# Patient Record
Sex: Female | Born: 1980 | Race: White | Hispanic: No | Marital: Married | Smoking: Never smoker
Health system: Southern US, Community
[De-identification: ages and names within clinical notes are randomized; demographics above are authoritative.]

## PROBLEM LIST (undated history)

## (undated) DIAGNOSIS — R011 Cardiac murmur, unspecified: Secondary | ICD-10-CM

## (undated) DIAGNOSIS — Z8619 Personal history of other infectious and parasitic diseases: Secondary | ICD-10-CM

## (undated) DIAGNOSIS — Z8489 Family history of other specified conditions: Secondary | ICD-10-CM

## (undated) HISTORY — DX: Personal history of other infectious and parasitic diseases: Z86.19

---

## 2004-06-14 ENCOUNTER — Emergency Department: Payer: Self-pay | Admitting: Internal Medicine

## 2007-02-14 ENCOUNTER — Emergency Department (HOSPITAL_COMMUNITY): Admission: EM | Admit: 2007-02-14 | Discharge: 2007-02-14 | Payer: Self-pay | Admitting: Emergency Medicine

## 2008-01-19 ENCOUNTER — Emergency Department (HOSPITAL_COMMUNITY): Admission: EM | Admit: 2008-01-19 | Discharge: 2008-01-20 | Payer: Self-pay | Admitting: Emergency Medicine

## 2008-01-19 LAB — TSH: TSH: 2.4 u[IU]/mL (ref <=5.90)

## 2009-06-03 ENCOUNTER — Ambulatory Visit (HOSPITAL_COMMUNITY): Admission: RE | Admit: 2009-06-03 | Discharge: 2009-06-03 | Payer: Self-pay | Admitting: Obstetrics and Gynecology

## 2009-11-01 ENCOUNTER — Inpatient Hospital Stay (HOSPITAL_COMMUNITY): Admission: AD | Admit: 2009-11-01 | Discharge: 2009-11-01 | Payer: Self-pay | Admitting: Obstetrics and Gynecology

## 2009-11-02 ENCOUNTER — Inpatient Hospital Stay (HOSPITAL_COMMUNITY): Admission: AD | Admit: 2009-11-02 | Discharge: 2009-11-05 | Payer: Self-pay | Admitting: Obstetrics and Gynecology

## 2009-11-05 ENCOUNTER — Encounter: Admission: RE | Admit: 2009-11-05 | Discharge: 2009-11-06 | Payer: Self-pay | Admitting: Obstetrics and Gynecology

## 2009-12-06 ENCOUNTER — Encounter
Admission: RE | Admit: 2009-12-06 | Discharge: 2010-01-05 | Payer: Self-pay | Source: Home / Self Care | Admitting: Obstetrics and Gynecology

## 2010-01-06 ENCOUNTER — Encounter
Admission: RE | Admit: 2010-01-06 | Discharge: 2010-01-13 | Payer: Self-pay | Source: Home / Self Care | Attending: Obstetrics and Gynecology | Admitting: Obstetrics and Gynecology

## 2010-04-24 LAB — CBC
HCT: 22.9 % — ABNORMAL LOW (ref 36.0–46.0)
Hemoglobin: 7.7 g/dL — ABNORMAL LOW (ref 12.0–15.0)
MCH: 28.5 pg (ref 26.0–34.0)
MCHC: 33.5 g/dL (ref 30.0–36.0)
MCHC: 33.6 g/dL (ref 30.0–36.0)
Platelets: 242 10*3/uL (ref 150–400)
RBC: 2.7 MIL/uL — ABNORMAL LOW (ref 3.87–5.11)
RBC: 3.34 MIL/uL — ABNORMAL LOW (ref 3.87–5.11)
RDW: 14.5 % (ref 11.5–15.5)
WBC: 17.3 10*3/uL — ABNORMAL HIGH (ref 4.0–10.5)

## 2010-04-24 LAB — BASIC METABOLIC PANEL
Creatinine, Ser: 0.86 mg/dL (ref 0.4–1.2)
GFR calc Af Amer: >60 mL/min (ref >60)
GFR calc non Af Amer: >60 mL/min (ref >60)
Glucose, Bld: 80 mg/dL (ref 70–99)
Potassium: 3.5 mEq/L (ref 3.5–5.1)

## 2010-11-14 LAB — POCT I-STAT, CHEM 8
BUN: 5 mg/dL — ABNORMAL LOW (ref 6–23)
Calcium, Ion: 1.19 mmol/L (ref 1.12–1.32)
Creatinine, Ser: 1.1 mg/dL (ref 0.4–1.2)
Glucose, Bld: 97 mg/dL (ref 70–99)
HCT: 41 % (ref 36.0–46.0)
Potassium: 3.4 mEq/L — ABNORMAL LOW (ref 3.5–5.1)
TCO2: 26 mmol/L (ref 0–100)

## 2010-11-14 LAB — WET PREP, GENITAL
Trich, Wet Prep: NONE SEEN
Yeast Wet Prep HPF POC: NONE SEEN

## 2010-11-14 LAB — URINE MICROSCOPIC-ADD ON

## 2010-11-14 LAB — URINALYSIS, ROUTINE W REFLEX MICROSCOPIC
Leukocytes, UA: NEGATIVE
Nitrite: NEGATIVE
Specific Gravity, Urine: 1 — ABNORMAL LOW (ref 1.005–1.030)
Urobilinogen, UA: 0.2 mg/dL (ref 0.0–1.0)

## 2010-11-14 LAB — DIFFERENTIAL
Eosinophils Relative: 3 % (ref 0–5)
Monocytes Relative: 7 % (ref 3–12)
Neutro Abs: 3.5 10*3/uL (ref 1.7–7.7)
Neutrophils Relative %: 48 % (ref 43–77)

## 2010-11-14 LAB — CBC
HCT: 40.2 % (ref 36.0–46.0)
Platelets: 185 10*3/uL (ref 150–400)
RDW: 11.9 % (ref 11.5–15.5)
WBC: 7.4 10*3/uL (ref 4.0–10.5)

## 2010-11-14 LAB — GC/CHLAMYDIA PROBE AMP, GENITAL: GC Probe Amp, Genital: NEGATIVE

## 2011-09-19 IMAGING — US US OB DETAIL+14 WK
1 series · 14 of 28 positions shown · non-contrast
Comparison: none

OBSTETRICAL ULTRASOUND:
 This ultrasound exam was performed in the [HOSPITAL] Ultrasound Department.  The OB US report was generated in the AS system, and faxed to the ordering physician.  This report is also available in [HOSPITAL]?s AccessANYware and in [REDACTED] PACS.

[Series 1: us ob detail +14 wk · 0.22mm/px · 14 of 102 slices shown]
[im 4/102]
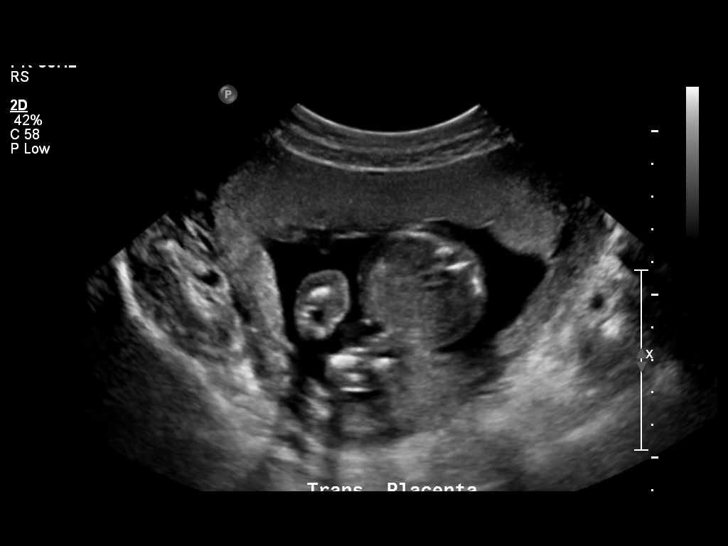
[im 12/102]
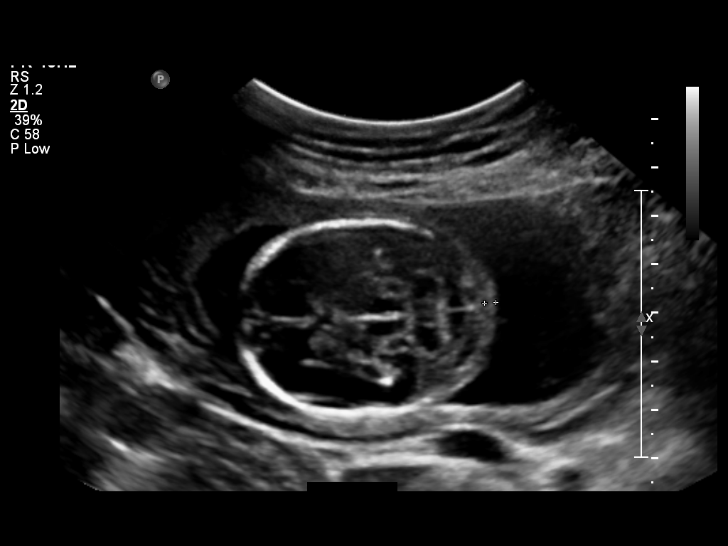
[im 19/102]
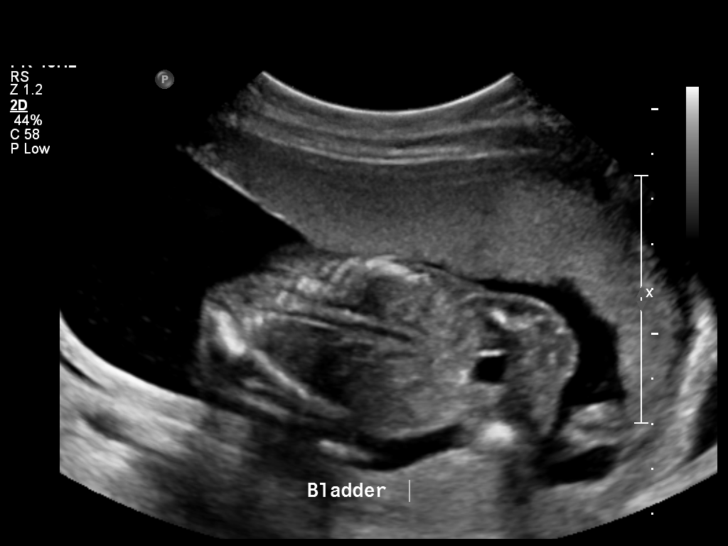
[im 27/102]
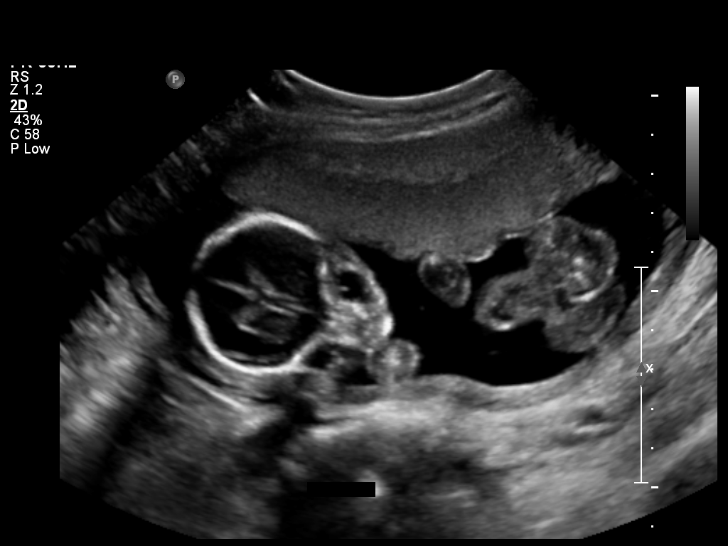
[im 34/102]
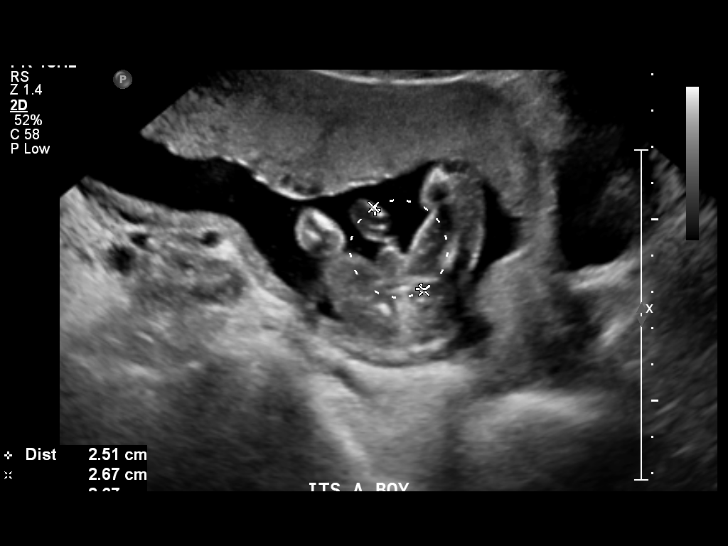
[im 42/102]
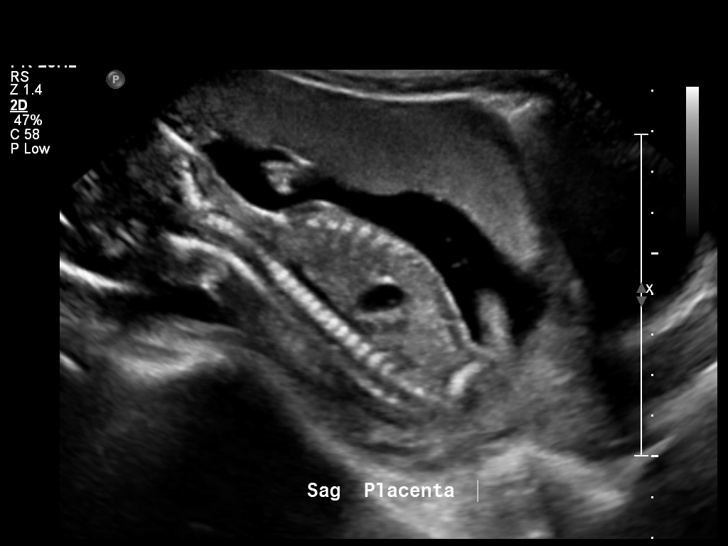
[im 49/102]
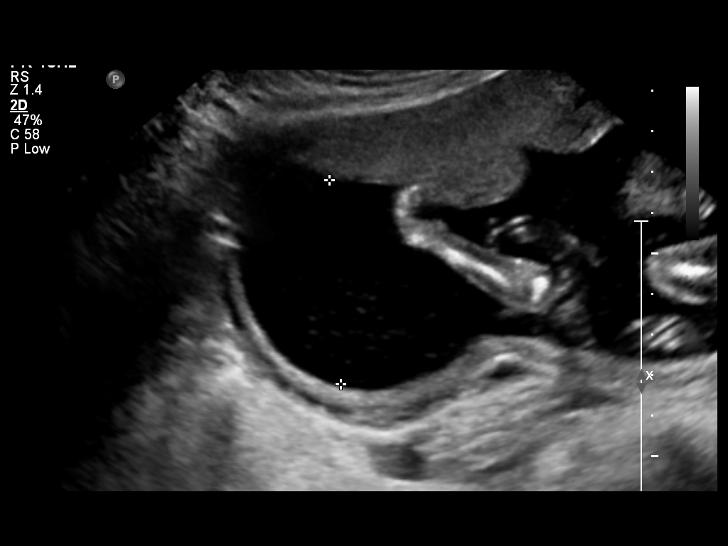
[im 57/102]
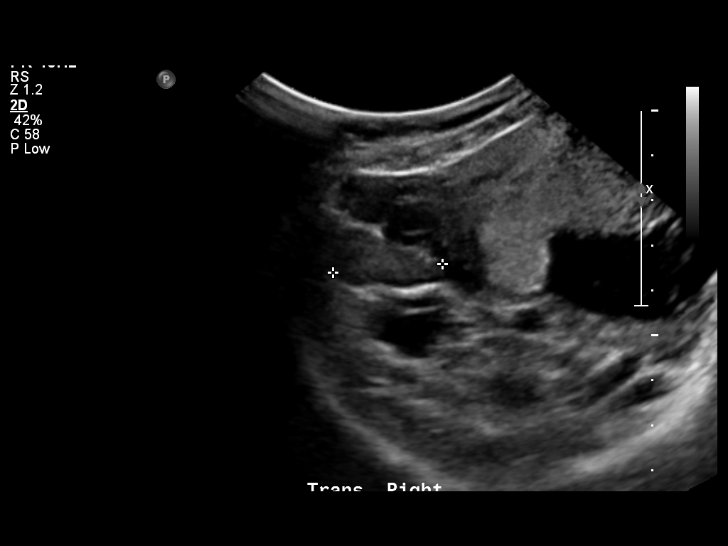
[im 64/102]
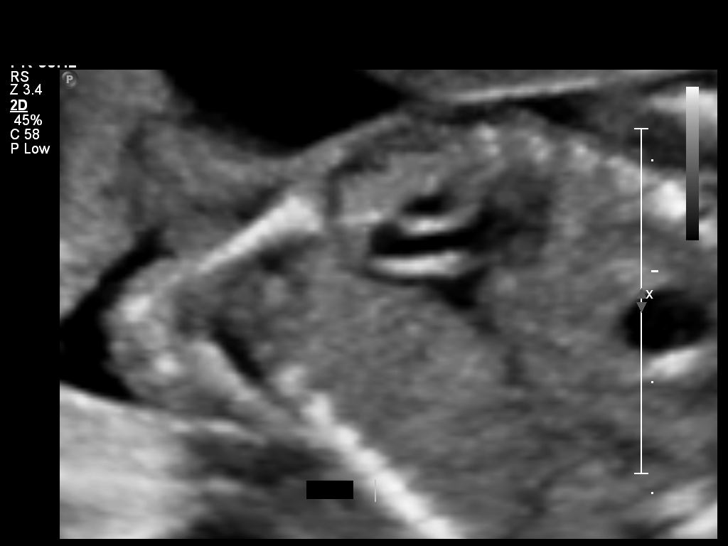
[im 72/102]
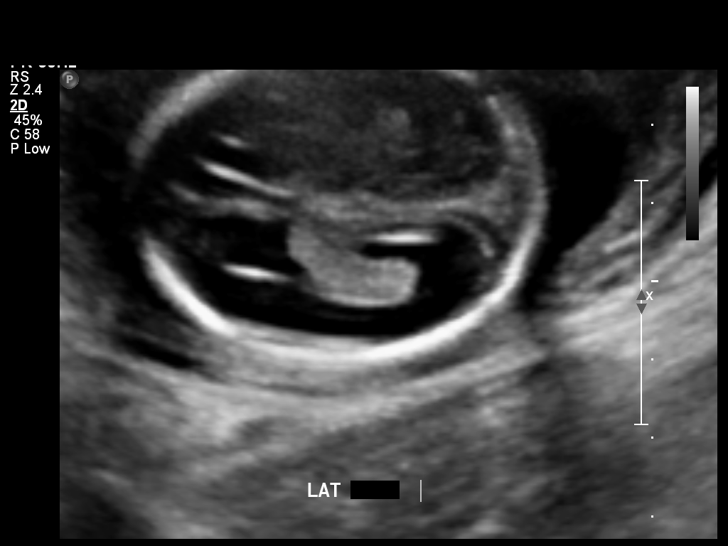
[im 79/102]
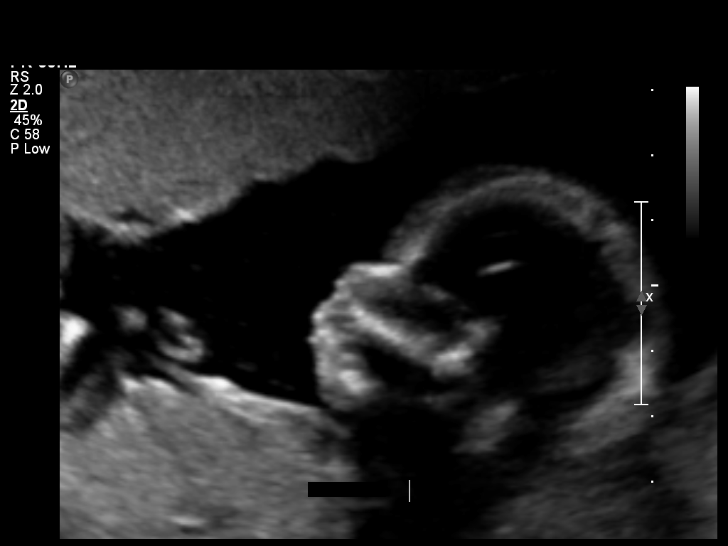
[im 87/102]
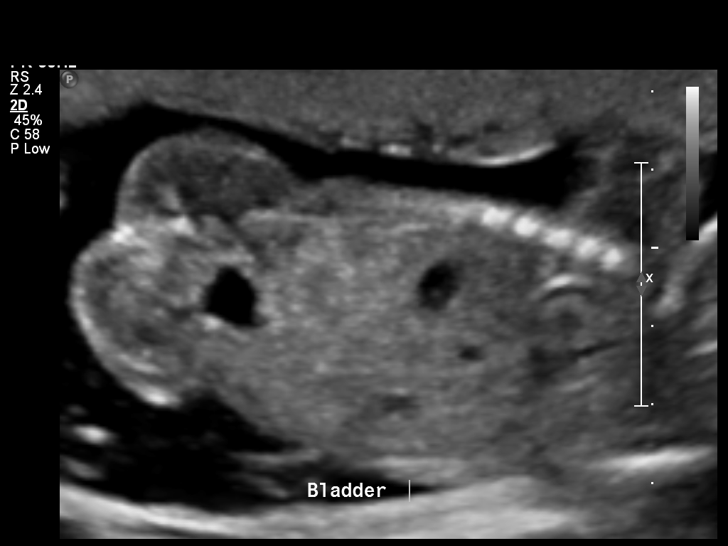
[im 94/102]
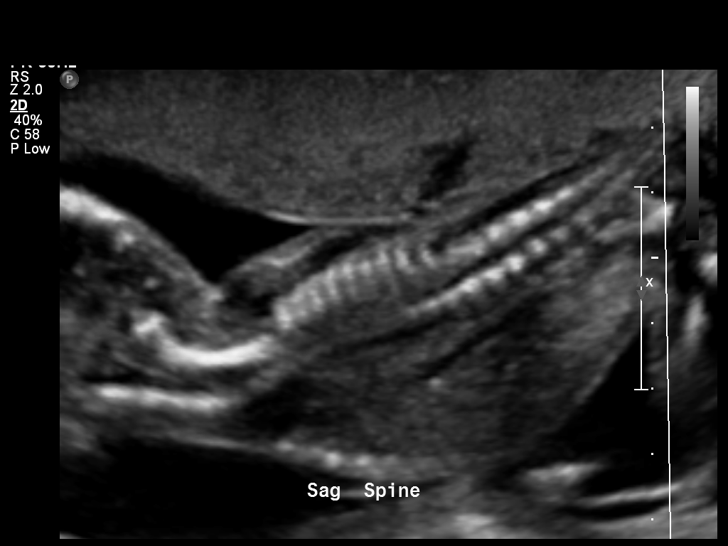
[im 102/102]
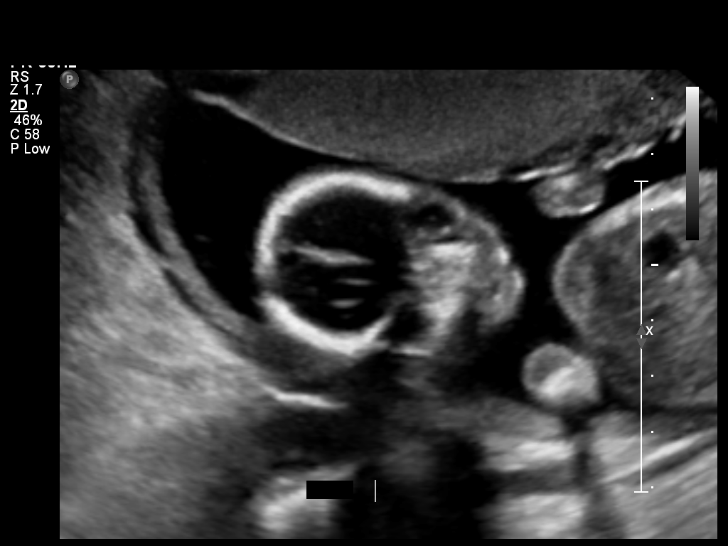

[14 of 28 positions shown; findings below may reference images not displayed]

IMPRESSION: See AS Obstetric US report.

## 2011-11-20 ENCOUNTER — Other Ambulatory Visit (HOSPITAL_COMMUNITY)
Admission: RE | Admit: 2011-11-20 | Discharge: 2011-11-20 | Disposition: A | Discharge status: Home / Self Care | Payer: BC Managed Care – PPO | Source: Ambulatory Visit | Attending: Internal Medicine | Admitting: Internal Medicine

## 2011-11-20 ENCOUNTER — Encounter: Payer: Self-pay | Admitting: Internal Medicine

## 2011-11-20 ENCOUNTER — Ambulatory Visit (INDEPENDENT_AMBULATORY_CARE_PROVIDER_SITE_OTHER): Payer: BC Managed Care – PPO | Admitting: Internal Medicine

## 2011-11-20 VITALS — BP 100/82 | HR 82 | Temp 98.3°F | Ht 60.0 in | Wt 122.2 lb

## 2011-11-20 DIAGNOSIS — R5383 Other fatigue: Secondary | ICD-10-CM

## 2011-11-20 DIAGNOSIS — Z139 Encounter for screening, unspecified: Secondary | ICD-10-CM

## 2011-11-20 DIAGNOSIS — Z01419 Encounter for gynecological examination (general) (routine) without abnormal findings: Secondary | ICD-10-CM | POA: Insufficient documentation

## 2011-11-20 DIAGNOSIS — Z1151 Encounter for screening for human papillomavirus (HPV): Secondary | ICD-10-CM | POA: Insufficient documentation

## 2011-11-20 DIAGNOSIS — R5381 Other malaise: Secondary | ICD-10-CM

## 2011-11-20 LAB — COMPREHENSIVE METABOLIC PANEL
Albumin: 3.6 g/dL (ref 3.5–5.2)
BUN: 13 mg/dL (ref 6–23)
CO2: 24 mEq/L (ref 19–32)
Calcium: 8.4 mg/dL (ref 8.4–10.5)
Chloride: 108 mEq/L (ref 96–112)
Glucose, Bld: 76 mg/dL (ref 70–99)
Potassium: 3.8 mEq/L (ref 3.5–5.1)

## 2011-11-20 LAB — CBC WITH DIFFERENTIAL/PLATELET
Basophils Relative: 0.3 % (ref 0.0–3.0)
Eosinophils Relative: 2.4 % (ref 0.0–5.0)
Lymphocytes Relative: 31.8 % (ref 12.0–46.0)
MCV: 89.3 fl (ref 78.0–100.0)
Neutrophils Relative %: 59.4 % (ref 43.0–77.0)
RBC: 4.62 Mil/uL (ref 3.87–5.11)
WBC: 5.8 10*3/uL (ref 4.5–10.5)

## 2011-11-20 LAB — LIPID PANEL
Cholesterol: 181 mg/dL (ref 0–200)
LDL Cholesterol: 126 mg/dL — ABNORMAL HIGH (ref 0–99)

## 2011-11-20 LAB — TSH: TSH: 3.32 u[IU]/mL (ref 0.35–5.50)

## 2011-11-20 MED ORDER — NORETHIN ACE-ETH ESTRAD-FE 1-20 MG-MCG PO TABS: 1.0000 | ORAL_TABLET | Freq: Every day | ORAL | Status: DC

## 2011-11-20 NOTE — Patient Instructions (Signed)
It was good seeing you today.  We will notify you of your lab results once they become available.  Let me know if any problems.

## 2011-11-20 NOTE — Progress Notes (Signed)
  Subjective:    Patient ID: Holly Oliver, female    DOB: [birth year], 31 y.o.   MRN: [id number]  HPI 31 year old female who comes in to establish care here at [facility].  I previously saw her at [facility].  She has been doing well.  No significant allergy issues.  Has a son two years old.  Doing well.  Has been under some stress recently.  Husband has some health issues and was out of a job.  He now has a job and is planning to start next week.  Doing better.  Periods are regular.  Taking Loestrin regularly.  Desires a refill.    Past Medical History  Diagnosis Date  . Chicken pox     Review of Systems Patient denies any headache, lightheadedness or dizziness.  No sinus congestion or allergy issues. No chest pain, tightness or palpatations.  No increased shortness of breath, cough or congestion.  No acid reflux, dysphagia or odynophagia.  No nausea or vomiting.  No abdominal pain or cramping.  No bowel change, such as diarrhea, constipation, BRBPR or melana.  No urine change.  No vaginal problems.  Has a two year old.  Had a C-section.  Was being followed at [facility].  Wants to get her physicals here now.  Last - 2012.   She does report noticing some fatigue.      Objective:   Physical Exam Filed Vitals:   11/20/11 0835  BP: 100/82  Pulse: 82  Temp: 98.3 F (36.8 C)   31year old female in no acute distress.   HEENT:  Nares- clear.  Oropharynx - without lesions. NECK:  Supple.  Nontender.  No audible bruit.  HEART:  Appears to be regular. LUNGS:  No crackles or wheezing audible.  Respirations even and unlabored.  RADIAL PULSE:  Equal bilaterally.    BREASTS:  No nipple discharge or nipple retraction present.  Could not appreciate any distinct nodules or axillary adenopathy.  ABDOMEN:  Soft, nontender.  Bowel sounds present and normal.  No audible abdominal bruit.  GU:  Normal external genitalia.  Vaginal vault without lesions.  Cervix identified.  Pap performed. Could not  appreciate any adnexal masses or tenderness.   RECTAL:  Not performed.    EXTREMITIES:  No increased edema present.  DP pulses palpable and equal bilaterally.        Assessment & Plan:  FATIGUE.  Will check cbc, met c and tsh to confirm no metabolic etiology.  Stress is better now.  Follow.   INCREASED PSYCHOSOCIAL STRESSORS.  Doing well.  Feels she is coping well.  Follow.  I spent over 25 minutes with the patient and more than 50% of the time spent discussing above.    GYN.  On Loestrin FE 1/20.  Doing well.  Periods regular.  Pelvic/pap today.   HEALTH MAINTENANCE.  Physical today.  Check cholesterol at patients request.  Labs to be checked as outlined.

## 2011-11-27 ENCOUNTER — Encounter: Payer: Self-pay | Admitting: *Deleted

## 2011-12-11 ENCOUNTER — Encounter: Payer: Self-pay | Admitting: Internal Medicine

## 2011-12-11 NOTE — Telephone Encounter (Signed)
Please call pt and notify her that I would like to see her - given these issues.  I can see her on 12/17/11 at 4:15.  Please confirm with her she is ok to wait until then for appt.  If so, then forward to Robin to put on schedule.  Thanks.

## 2012-01-01 ENCOUNTER — Encounter: Payer: BC Managed Care – PPO | Admitting: Internal Medicine

## 2012-01-01 ENCOUNTER — Encounter: Payer: Self-pay | Admitting: Internal Medicine

## 2012-01-01 ENCOUNTER — Ambulatory Visit: Payer: BC Managed Care – PPO | Admitting: Internal Medicine

## 2012-01-08 ENCOUNTER — Encounter: Payer: Self-pay | Admitting: *Deleted

## 2012-01-20 ENCOUNTER — Ambulatory Visit: Payer: BC Managed Care – PPO | Admitting: Internal Medicine

## 2012-01-20 ENCOUNTER — Encounter: Payer: Self-pay | Admitting: Internal Medicine

## 2012-01-20 ENCOUNTER — Encounter: Payer: Self-pay | Admitting: *Deleted

## 2012-01-20 NOTE — Progress Notes (Signed)
  Subjective:    Patient ID: Holly Oliver, female    DOB: [birth year], 31 y.o.   MRN: [id number]  HPI   Review of Systems     Objective:   Physical Exam        Assessment & Plan:  The patient did not show for this appt.  This was opened for abstraction only.

## 2012-03-26 ENCOUNTER — Other Ambulatory Visit: Payer: Self-pay

## 2012-04-05 ENCOUNTER — Telehealth: Payer: Self-pay | Admitting: Internal Medicine

## 2012-04-05 NOTE — Telephone Encounter (Signed)
Would like to get the plan B prescribed.

## 2012-04-05 NOTE — Telephone Encounter (Signed)
Called patient to get more information but no answer and mail box on phone is full.

## 2012-04-05 NOTE — Telephone Encounter (Signed)
Need information from pt

## 2012-04-06 NOTE — Telephone Encounter (Signed)
Discussed with pt.  I explained to her that I did not prescribe this.  She informed me she was aware it was otc.  I also informed her that she could go to the health dept.  She expressed understanding.

## 2012-04-06 NOTE — Telephone Encounter (Signed)
Messed up on birth control a day or to before intercourse. Has concern about having conceived.

## 2012-05-10 ENCOUNTER — Ambulatory Visit (INDEPENDENT_AMBULATORY_CARE_PROVIDER_SITE_OTHER): Payer: BC Managed Care – PPO | Admitting: Adult Health

## 2012-05-10 ENCOUNTER — Encounter: Payer: Self-pay | Admitting: Adult Health

## 2012-05-10 VITALS — BP 103/72 | HR 80 | Temp 98.2°F | Resp 14 | Ht 60.0 in | Wt 124.0 lb

## 2012-05-10 DIAGNOSIS — R5383 Other fatigue: Secondary | ICD-10-CM | POA: Insufficient documentation

## 2012-05-10 DIAGNOSIS — R300 Dysuria: Secondary | ICD-10-CM

## 2012-05-10 LAB — POCT URINALYSIS DIPSTICK
Bilirubin, UA: NEGATIVE
Ketones, UA: NEGATIVE
Protein, UA: 30

## 2012-05-10 MED ORDER — CIPROFLOXACIN HCL 250 MG PO TABS: 250.0000 mg | ORAL_TABLET | Freq: Two times a day (BID) | ORAL | Status: DC

## 2012-05-10 NOTE — Patient Instructions (Addendum)
  Please start your antibiotic today - Cipro 250 mg twice a day for 3 days.  You can continue the AZO for 2 more days if needed.

## 2012-05-10 NOTE — Assessment & Plan Note (Signed)
Urine dipstick shows positive nitrite, small amount of blood. Send urine for culture and microscopic eval. Start Cipro x 3 days.

## 2012-05-10 NOTE — Progress Notes (Signed)
  Subjective:    Patient ID: Holly Oliver, female    DOB: [birth year], 32 y.o.   MRN: [id number]  HPI  Patient presents to clinic with c/o dysuria which began yesterday. She is taking AZO which has relieved her symptoms but not completely.    Review of Systems  Constitutional: Negative for fever and chills.  Genitourinary: Positive for dysuria. Negative for hematuria, flank pain, vaginal discharge and difficulty urinating.   BP 103/72  Pulse 80  Temp(Src) 98.2 F (36.8 C) (Oral)  Resp 14  Ht 5' (1.524 m)  Wt 124 lb (56.246 kg)  BMI 24.22 kg/m2  SpO2 97%  LMP 05/02/2012     Objective:   Physical Exam  Constitutional: She is oriented to person, place, and time. She appears well-developed and well-nourished. No distress.  Cardiovascular: Normal rate and regular rhythm.   Pulmonary/Chest: Effort normal.  Neurological: She is alert and oriented to person, place, and time.  Skin: Skin is warm and dry.  Psychiatric: She has a normal mood and affect. Her behavior is normal.          Assessment & Plan:

## 2012-05-13 LAB — URINE CULTURE

## 2012-11-21 ENCOUNTER — Other Ambulatory Visit: Payer: Self-pay | Admitting: Internal Medicine

## 2012-12-08 ENCOUNTER — Ambulatory Visit (INDEPENDENT_AMBULATORY_CARE_PROVIDER_SITE_OTHER): Payer: BC Managed Care – PPO | Admitting: Adult Health

## 2012-12-08 ENCOUNTER — Telehealth: Payer: Self-pay | Admitting: *Deleted

## 2012-12-08 ENCOUNTER — Encounter: Payer: Self-pay | Admitting: Adult Health

## 2012-12-08 VITALS — BP 102/60 | HR 110 | Temp 98.3°F | Resp 12 | Wt 125.5 lb

## 2012-12-08 DIAGNOSIS — R635 Abnormal weight gain: Secondary | ICD-10-CM | POA: Insufficient documentation

## 2012-12-08 DIAGNOSIS — R109 Unspecified abdominal pain: Secondary | ICD-10-CM

## 2012-12-08 DIAGNOSIS — R509 Fever, unspecified: Secondary | ICD-10-CM

## 2012-12-08 LAB — POCT URINALYSIS DIPSTICK
Glucose, UA: NEGATIVE
Leukocytes, UA: NEGATIVE
Nitrite, UA: NEGATIVE
Protein, UA: 30
Urobilinogen, UA: 0.2
pH, UA: 5.5

## 2012-12-08 MED ORDER — AMOXICILLIN-POT CLAVULANATE 875-125 MG PO TABS: 1.0000 | ORAL_TABLET | Freq: Two times a day (BID) | ORAL | Status: DC

## 2012-12-08 NOTE — Patient Instructions (Signed)
Start Augmentin twice daily for 10 days. Push fluids. Continue tylenol for fever. May alternate with ibuprofen.  Call if you develop any other symptoms or if your fever does not improve.

## 2012-12-08 NOTE — Addendum Note (Signed)
Addended by: Montine Circle D on: 12/08/2012 02:04 PM   Modules accepted: Orders

## 2012-12-08 NOTE — Assessment & Plan Note (Addendum)
Augmentin bid x 10 days. Push fluids. Continue tylenol for fever. May alternate with ibuprofen. RTC or call immediately if symptoms worsen or develops other symptoms.

## 2012-12-08 NOTE — Progress Notes (Signed)
  Subjective:    Patient ID: Holly Oliver, female    DOB: Nov 08, 1980, 32 y.o.   MRN: [id number]  HPI  Pt presents with flank pain x 3 days which has improved. Pt reports generalized body aches and sore throat beginning 2 days ago. Developed fever yesterday, highest 102.  Has been taking Tylenol for fever. She works at [employer] with [employer]. She denies any sick contacts (that she has been aware of). No GI symptoms.   Review of Systems  Constitutional: Positive for fever and chills.  HENT: Positive for sore throat. Negative for trouble swallowing.   Respiratory: Negative for cough, chest tightness, shortness of breath and wheezing.   Genitourinary: Positive for flank pain. Negative for dysuria, frequency and pelvic pain.       Objective:   Physical Exam  Constitutional: She is oriented to person, place, and time. She appears well-developed and well-nourished.  HENT:  Head: Normocephalic and atraumatic.  Right Ear: External ear normal.  Left Ear: External ear normal.  Mouth/Throat: Mucous membranes are normal. Posterior oropharyngeal erythema present. No oropharyngeal exudate or posterior oropharyngeal edema.  Cardiovascular: Normal rate and regular rhythm.   Pulmonary/Chest: Effort normal and breath sounds normal. No respiratory distress. She has no wheezes.  Genitourinary:  No CVA tenderness  Lymphadenopathy:    She has cervical adenopathy.  Neurological: She is alert and oriented to person, place, and time.  Skin: Skin is warm and dry.  Psychiatric: She has a normal mood and affect. Her behavior is normal. Judgment and thought content normal.    BP 102/60  Pulse 110  Temp(Src) 98.3 F (36.8 C) (Oral)  Resp 12  Wt 125 lb 8 oz (56.926 kg)  BMI 24.51 kg/m2  SpO2 97%       Assessment & Plan:

## 2012-12-08 NOTE — Telephone Encounter (Signed)
Would you like a urine culture?  

## 2012-12-08 NOTE — Telephone Encounter (Signed)
Yes, go ahead and send it.

## 2012-12-09 ENCOUNTER — Encounter: Payer: Self-pay | Admitting: Adult Health

## 2012-12-09 LAB — URINE CULTURE: Organism ID, Bacteria: NO GROWTH

## 2012-12-15 ENCOUNTER — Other Ambulatory Visit: Payer: Self-pay

## 2013-01-02 ENCOUNTER — Encounter: Payer: BC Managed Care – PPO | Admitting: Internal Medicine

## 2013-03-20 ENCOUNTER — Telehealth: Payer: Self-pay | Admitting: Internal Medicine

## 2013-03-20 NOTE — Telephone Encounter (Signed)
[facility] [address] [place] Son diagnosed with type a flu today and wanted to get tama flu for herself

## 2013-03-20 NOTE — Telephone Encounter (Signed)
Pt currently has no symptoms

## 2013-03-21 ENCOUNTER — Other Ambulatory Visit: Payer: Self-pay | Admitting: *Deleted

## 2013-03-21 MED ORDER — OSELTAMIVIR PHOSPHATE 75 MG PO CAPS: 75.0000 mg | ORAL_CAPSULE | Freq: Every day | ORAL | Status: DC

## 2013-03-21 NOTE — Telephone Encounter (Signed)
Since household contact, can call in tamiflu 75mg  one per day for 10 days.  Let us know if any problems.

## 2013-03-21 NOTE — Telephone Encounter (Signed)
Send Rx to [facility] [place] & sent pt a mychart message

## 2013-07-14 ENCOUNTER — Ambulatory Visit: Payer: BC Managed Care – PPO | Admitting: Internal Medicine

## 2013-07-24 ENCOUNTER — Ambulatory Visit (INDEPENDENT_AMBULATORY_CARE_PROVIDER_SITE_OTHER): Payer: BC Managed Care – PPO | Admitting: Internal Medicine

## 2013-07-24 ENCOUNTER — Encounter: Payer: Self-pay | Admitting: Internal Medicine

## 2013-07-24 VITALS — BP 100/70 | HR 86 | Temp 98.3°F | Ht 60.0 in | Wt 133.5 lb

## 2013-07-24 DIAGNOSIS — R635 Abnormal weight gain: Secondary | ICD-10-CM

## 2013-07-24 DIAGNOSIS — R5383 Other fatigue: Secondary | ICD-10-CM

## 2013-07-24 DIAGNOSIS — R5382 Chronic fatigue, unspecified: Secondary | ICD-10-CM

## 2013-07-24 DIAGNOSIS — R5381 Other malaise: Secondary | ICD-10-CM

## 2013-07-24 DIAGNOSIS — Z13220 Encounter for screening for lipoid disorders: Secondary | ICD-10-CM

## 2013-07-24 LAB — LIPID PANEL
CHOL/HDL RATIO: 4
Cholesterol: 190 mg/dL (ref 0–200)
HDL: 49.4 mg/dL (ref >39.00)
LDL Cholesterol: 124 mg/dL — ABNORMAL HIGH (ref 0–99)
NonHDL: 140.6
TRIGLYCERIDES: 84 mg/dL (ref 0.0–149.0)
VLDL: 16.8 mg/dL (ref 0.0–40.0)

## 2013-07-24 LAB — COMPREHENSIVE METABOLIC PANEL
ALK PHOS: 54 U/L (ref 39–117)
ALT: 9 U/L (ref 0–35)
AST: 19 U/L (ref 0–37)
Albumin: 4 g/dL (ref 3.5–5.2)
BILIRUBIN TOTAL: 0.4 mg/dL (ref 0.2–1.2)
BUN: 12 mg/dL (ref 6–23)
CALCIUM: 8.9 mg/dL (ref 8.4–10.5)
CHLORIDE: 107 meq/L (ref 96–112)
CO2: 25 mEq/L (ref 19–32)
CREATININE: 1.2 mg/dL (ref 0.4–1.2)
GFR: 53.95 mL/min — ABNORMAL LOW (ref >60.00)
Glucose, Bld: 91 mg/dL (ref 70–99)
POTASSIUM: 4.4 meq/L (ref 3.5–5.1)
Sodium: 139 mEq/L (ref 135–145)
Total Protein: 7.2 g/dL (ref 6.0–8.3)

## 2013-07-24 LAB — CBC WITH DIFFERENTIAL/PLATELET
BASOS PCT: 0.4 % (ref 0.0–3.0)
Basophils Absolute: 0 10*3/uL (ref 0.0–0.1)
EOS PCT: 2.1 % (ref 0.0–5.0)
Eosinophils Absolute: 0.1 10*3/uL (ref 0.0–0.7)
HCT: 39 % (ref 36.0–46.0)
Hemoglobin: 12.7 g/dL (ref 12.0–15.0)
Lymphocytes Relative: 36.7 % (ref 12.0–46.0)
Lymphs Abs: 2.2 10*3/uL (ref 0.7–4.0)
MCHC: 32.7 g/dL (ref 30.0–36.0)
MCV: 87 fl (ref 78.0–100.0)
Monocytes Absolute: 0.4 10*3/uL (ref 0.1–1.0)
Monocytes Relative: 6.9 % (ref 3.0–12.0)
NEUTROS PCT: 53.9 % (ref 43.0–77.0)
Neutro Abs: 3.2 10*3/uL (ref 1.4–7.7)
Platelets: 183 10*3/uL (ref 150.0–400.0)
RBC: 4.49 Mil/uL (ref 3.87–5.11)
RDW: 13.6 % (ref 11.5–15.5)
WBC: 5.9 10*3/uL (ref 4.0–10.5)

## 2013-07-24 LAB — TSH: TSH: 1.5 u[IU]/mL (ref 0.35–4.50)

## 2013-07-24 NOTE — Progress Notes (Signed)
Pre visit review using our clinic review tool, if applicable. No additional management support is needed unless otherwise documented below in the visit note. 

## 2013-07-25 ENCOUNTER — Encounter: Payer: Self-pay | Admitting: *Deleted

## 2013-07-25 ENCOUNTER — Other Ambulatory Visit: Payer: Self-pay | Admitting: Internal Medicine

## 2013-07-25 ENCOUNTER — Encounter: Payer: Self-pay | Admitting: Internal Medicine

## 2013-07-25 DIAGNOSIS — N289 Disorder of kidney and ureter, unspecified: Secondary | ICD-10-CM

## 2013-07-25 NOTE — Progress Notes (Signed)
Order placed for f/u met b.  

## 2013-07-27 NOTE — Telephone Encounter (Signed)
Pt aware of need for lab appt, see other mychart message. Lab appt has been scheduled.

## 2013-07-30 ENCOUNTER — Encounter: Payer: Self-pay | Admitting: Internal Medicine

## 2013-07-30 NOTE — Progress Notes (Signed)
  Subjective:    Patient ID: Holly Oliver, female    DOB: 05-28-1980, 33 y.o.   MRN: [id number]  HPI 33 year old female who comes in today as a work in with concerns regarding weight gain and fatigue.  She has been doing well.  No significant allergy issues.  Breathing stable.  She does report concerns regarding weight gain.  Weighed 124 pounds 05/29/12.   In 11/28/12 - 125.8 pounds.  She has been walking.  Trying to watch what she eats.  Uses condoms for birth control.  Bowels sable.  Periods regular.      Past Medical History  Diagnosis Date  . History of chicken pox     Review of Systems Patient denies any headache, lightheadedness or dizziness.  No sinus congestion or allergy issues. No chest pain, tightness or palpitations.  No increased shortness of breath, cough or congestion.  No acid reflux, dysphagia or odynophagia.  No nausea or vomiting.  No abdominal pain or cramping.  No bowel change, such as diarrhea, constipation, BRBPR or melana.  No urine change.  No vaginal problems.  She does report noticing some fatigue.      Objective:   Physical Exam  Filed Vitals:   07/24/13 0946  BP: 100/70  Pulse: 86  Temp: 98.3 F (74.48 C)   33 year old female in no acute distress.   HEENT:  Nares- clear.  Oropharynx - without lesions. NECK:  Supple.  Nontender.  No audible bruit.  HEART:  Appears to be regular. LUNGS:  No crackles or wheezing audible.  Respirations even and unlabored.  RADIAL PULSE:  Equal bilaterally.   ABDOMEN:  Soft, nontender.  Bowel sounds present and normal.  No audible abdominal bruit.    EXTREMITIES:  No increased edema present.  DP pulses palpable and equal bilaterally.        Assessment & Plan:  FATIGUE.  Will check cbc, met c and tsh to confirm no metabolic etiology.  Stress is better now.  Follow.   INCREASED PSYCHOSOCIAL STRESSORS.  Doing well.  Feels she is coping well.  Follow.     HEALTH MAINTENANCE.  Physical is scheduled for 8/15.  Check  cholesterol.  Labs to be checked as outlined.

## 2013-07-30 NOTE — Assessment & Plan Note (Signed)
She is concerned regarding weight gain.  Check tsh.  Discussed diet and exercise.  Follow.

## 2013-07-30 NOTE — Assessment & Plan Note (Signed)
Increased fatigue.  Check cbc, met c and tsh.   Follow.    

## 2013-08-02 ENCOUNTER — Other Ambulatory Visit: Payer: BC Managed Care – PPO

## 2013-08-07 ENCOUNTER — Other Ambulatory Visit: Payer: Self-pay | Admitting: Internal Medicine

## 2013-08-07 ENCOUNTER — Encounter: Payer: Self-pay | Admitting: Internal Medicine

## 2013-08-07 ENCOUNTER — Other Ambulatory Visit (INDEPENDENT_AMBULATORY_CARE_PROVIDER_SITE_OTHER): Payer: BC Managed Care – PPO

## 2013-08-07 DIAGNOSIS — N289 Disorder of kidney and ureter, unspecified: Secondary | ICD-10-CM

## 2013-08-07 LAB — BASIC METABOLIC PANEL
BUN: 10 mg/dL (ref 6–23)
CHLORIDE: 107 meq/L (ref 96–112)
CO2: 30 mEq/L (ref 19–32)
Calcium: 8.9 mg/dL (ref 8.4–10.5)
Creatinine, Ser: 1.2 mg/dL (ref 0.4–1.2)
GFR: 56.04 mL/min — AB (ref >60.00)
GLUCOSE: 124 mg/dL — AB (ref 70–99)
Potassium: 4.2 mEq/L (ref 3.5–5.1)
Sodium: 140 mEq/L (ref 135–145)

## 2013-08-07 NOTE — Progress Notes (Signed)
Order placed for f/u met b 

## 2013-08-08 ENCOUNTER — Encounter: Payer: Self-pay | Admitting: *Deleted

## 2013-08-09 NOTE — Telephone Encounter (Signed)
Unread mychart message mailed to patient 

## 2013-09-07 ENCOUNTER — Other Ambulatory Visit: Payer: BC Managed Care – PPO

## 2013-09-11 ENCOUNTER — Encounter: Payer: BC Managed Care – PPO | Admitting: Internal Medicine

## 2013-09-11 DIAGNOSIS — Z0289 Encounter for other administrative examinations: Secondary | ICD-10-CM

## 2014-12-05 ENCOUNTER — Encounter: Payer: Self-pay | Admitting: Internal Medicine

## 2014-12-05 NOTE — Telephone Encounter (Signed)
Please notify pt that I have not seen her in over one year (1 1/2 years).  She needs an appt with me.  One of Dr Amalia Hailey partners may be able to prescribe in his absence.

## 2014-12-21 ENCOUNTER — Ambulatory Visit: Payer: BC Managed Care – PPO | Admitting: Internal Medicine

## 2015-03-29 ENCOUNTER — Ambulatory Visit (INDEPENDENT_AMBULATORY_CARE_PROVIDER_SITE_OTHER): Payer: BC Managed Care – PPO | Admitting: Internal Medicine

## 2015-03-29 ENCOUNTER — Other Ambulatory Visit (HOSPITAL_COMMUNITY)
Admission: RE | Admit: 2015-03-29 | Discharge: 2015-03-29 | Disposition: A | Discharge status: Home / Self Care | Payer: BC Managed Care – PPO | Source: Ambulatory Visit | Attending: Internal Medicine | Admitting: Internal Medicine

## 2015-03-29 ENCOUNTER — Encounter: Payer: Self-pay | Admitting: Internal Medicine

## 2015-03-29 VITALS — BP 120/78 | HR 87 | Temp 98.3°F | Resp 18 | Ht 60.0 in | Wt 140.4 lb

## 2015-03-29 DIAGNOSIS — Z1151 Encounter for screening for human papillomavirus (HPV): Secondary | ICD-10-CM | POA: Insufficient documentation

## 2015-03-29 DIAGNOSIS — H938X1 Other specified disorders of right ear: Secondary | ICD-10-CM | POA: Diagnosis not present

## 2015-03-29 DIAGNOSIS — Z124 Encounter for screening for malignant neoplasm of cervix: Secondary | ICD-10-CM | POA: Diagnosis not present

## 2015-03-29 DIAGNOSIS — Z01419 Encounter for gynecological examination (general) (routine) without abnormal findings: Secondary | ICD-10-CM | POA: Insufficient documentation

## 2015-03-29 DIAGNOSIS — Z0000 Encounter for general adult medical examination without abnormal findings: Secondary | ICD-10-CM | POA: Diagnosis not present

## 2015-03-29 DIAGNOSIS — R635 Abnormal weight gain: Secondary | ICD-10-CM

## 2015-03-29 NOTE — Progress Notes (Signed)
Pre-visit discussion using our clinic review tool. No additional management support is needed unless otherwise documented below in the visit note.  

## 2015-03-29 NOTE — Progress Notes (Signed)
Patient ID: Holly Oliver, female   DOB: 01-26-1981, 35 y.o.   MRN: [id number]

## 2015-03-29 NOTE — Patient Instructions (Signed)
nasacort nasal spray - 2 sprays each nostril one time per day.  Do this in the evening.   Afrin nasal spray - 2 sprays each nostril 2x/day for three days and then stop.

## 2015-03-29 NOTE — Progress Notes (Signed)
Patient ID: Holly Oliver, female   DOB: September 25, 1980, 35 y.o.   MRN: [id number]   Subjective:    Patient ID: Holly Oliver, female    DOB: 1980/03/14, 35 y.o.   MRN: [id number]  HPI  Patient presents for her physical exam.  Increased stress with her job.  She is starting a new job next week.  Feels this will improve her stress level.  Does not feel she needs any further intervention at this time.  Was having some congestion.  Was taking sudafed.  Is better.  Still feels right ear is muffled at times.  No pain.  No significant hearing change.  Breathing stable.  No abdominal pain or cramping. Bowels stable.  LMP started Saturday.  Regular periods.  Husband has had a vasectomy.     Past Medical History  Diagnosis Date  . History of chicken pox    Past Surgical History  Procedure Laterality Date  . Cesarean section  11/02/09   Family History  Problem Relation Age of Onset  . Hypertension Mother   . Hyperlipidemia Father   . Hyperlipidemia Mother   . Lung cancer Maternal Grandfather   . Breast cancer Maternal Aunt     great aunt  . Prostate cancer Maternal Grandfather   . Diabetes Maternal Aunt    Social History   Social History  . Marital Status: Married    Spouse Name: N/A  . Number of Children: 1  . Years of Education: N/A   Occupational History  . [employer]   Social History Main Topics  . Smoking status: Never Smoker   . Smokeless tobacco: Never Used  . Alcohol Use: No  . Drug Use: No  . Sexual Activity: Not Asked   Other Topics Concern  . None   Social History Narrative    No outpatient encounter prescriptions on file as of 03/29/2015.   No facility-administered encounter medications on file as of 03/29/2015.    Review of Systems  Constitutional: Negative for fever and appetite change.  HENT: Negative for congestion and sinus pressure.   Eyes: Negative for pain and visual disturbance.       Some ear fullness as outlined.    Respiratory: Negative  for cough, chest tightness and shortness of breath.   Cardiovascular: Negative for chest pain, palpitations and leg swelling.  Gastrointestinal: Negative for nausea, vomiting, abdominal pain and diarrhea.  Genitourinary: Negative for dysuria and difficulty urinating.  Musculoskeletal: Negative for back pain and joint swelling.  Skin: Negative for color change and rash.  Neurological: Negative for dizziness, light-headedness and headaches.  Hematological: Negative for adenopathy. Does not bruise/bleed easily.  Psychiatric/Behavioral: Negative for dysphoric mood and agitation.       Objective:    Physical Exam  Constitutional: She is oriented to person, place, and time. She appears well-developed and well-nourished. No distress.  HENT:  Nose: Nose normal.  Mouth/Throat: Oropharynx is clear and moist.  Eyes: Right eye exhibits no discharge. Left eye exhibits no discharge. No scleral icterus.  Neck: Neck supple. No thyromegaly present.  Cardiovascular: Normal rate and regular rhythm.   Pulmonary/Chest: Breath sounds normal. No accessory muscle usage. No tachypnea. No respiratory distress. She has no decreased breath sounds. She has no wheezes. She has no rhonchi. Right breast exhibits no inverted nipple, no mass, no nipple discharge and no tenderness (no axillary adenopathy). Left breast exhibits no inverted nipple, no mass, no nipple discharge and no tenderness (no axilarry adenopathy).  Abdominal: Soft. Bowel  sounds are normal. There is no tenderness.  Genitourinary:  Normal external genitalia.  Vaginal vault without lesions.  Cervix identified.  Pap smear performed.  Could not appreciate any adnexal masses or tenderness.    Musculoskeletal: She exhibits no edema or tenderness.  Lymphadenopathy:    She has no cervical adenopathy.  Neurological: She is alert and oriented to person, place, and time.  Skin: Skin is warm. No rash noted. No erythema.  Psychiatric: She has a normal mood and  affect. Her behavior is normal.    BP 120/78 mmHg  Pulse 87  Temp(Src) 98.3 F (36.8 C) (Oral)  Resp 18  Ht 5' (1.524 m)  Wt 140 lb 6 oz (63.674 kg)  BMI 27.42 kg/m2  SpO2 94%  LMP 03/23/2015 (Exact Date) Wt Readings from Last 3 Encounters:  03/29/15 140 lb 6 oz (63.674 kg)  07/24/13 133 lb 8 oz (60.555 kg)  12/08/12 125 lb 8 oz (56.926 kg)     Lab Results  Component Value Date   WBC 5.9 07/24/2013   HGB 12.7 07/24/2013   HCT 39.0 07/24/2013   PLT 183.0 07/24/2013   GLUCOSE 124* 08/07/2013   CHOL 190 07/24/2013   TRIG 84.0 07/24/2013   HDL 49.40 07/24/2013   LDLCALC 124* 07/24/2013   ALT 9 07/24/2013   AST 19 07/24/2013   NA 140 08/07/2013   K 4.2 08/07/2013   CL 107 08/07/2013   CREATININE 1.2 08/07/2013   BUN 10 08/07/2013   CO2 30 08/07/2013   TSH 1.50 07/24/2013       Assessment & Plan:   Problem List Items Addressed This Visit    Ear fullness    Right ear fullness.  No erythema or fluid noted on exam.  Afrin nasal spray and nasacort nasal spray as outlined.  Follow.        Health care maintenance    Physical today 03/29/15.  PAP today.  Check cholesterol.        Relevant Orders   Lipid panel   Weight gain    Has gained weight.  Discussed diet and exercise.  Follow.  Check routine labs, including tsh, etc.        Relevant Orders   CBC with Differential/Platelet   Comprehensive metabolic panel   TSH    Other Visit Diagnoses    Pap smear for cervical cancer screening    -  Primary    Relevant Orders    Cytology - PAP        Einar Pheasant, MD

## 2015-03-31 ENCOUNTER — Encounter: Payer: Self-pay | Admitting: Internal Medicine

## 2015-03-31 DIAGNOSIS — Z0000 Encounter for general adult medical examination without abnormal findings: Secondary | ICD-10-CM | POA: Insufficient documentation

## 2015-03-31 DIAGNOSIS — H938X9 Other specified disorders of ear, unspecified ear: Secondary | ICD-10-CM | POA: Insufficient documentation

## 2015-03-31 NOTE — Assessment & Plan Note (Signed)
Right ear fullness.  No erythema or fluid noted on exam.  Afrin nasal spray and nasacort nasal spray as outlined.  Follow.

## 2015-03-31 NOTE — Assessment & Plan Note (Signed)
Physical today 03/29/15.  PAP today.  Check cholesterol.

## 2015-03-31 NOTE — Assessment & Plan Note (Signed)
Has gained weight.  Discussed diet and exercise.  Follow.  Check routine labs, including tsh, etc.

## 2015-04-02 LAB — CYTOLOGY - PAP

## 2015-04-03 ENCOUNTER — Encounter: Payer: Self-pay | Admitting: Internal Medicine

## 2015-04-10 NOTE — Telephone Encounter (Signed)
Unread mychart message mailed to patient 

## 2015-04-23 ENCOUNTER — Other Ambulatory Visit (INDEPENDENT_AMBULATORY_CARE_PROVIDER_SITE_OTHER): Payer: BC Managed Care – PPO

## 2015-04-23 DIAGNOSIS — Z0000 Encounter for general adult medical examination without abnormal findings: Secondary | ICD-10-CM | POA: Diagnosis not present

## 2015-04-23 DIAGNOSIS — R635 Abnormal weight gain: Secondary | ICD-10-CM

## 2015-04-23 LAB — COMPREHENSIVE METABOLIC PANEL
ALK PHOS: 55 U/L (ref 39–117)
ALT: 10 U/L (ref 0–35)
AST: 15 U/L (ref 0–37)
Albumin: 4 g/dL (ref 3.5–5.2)
BILIRUBIN TOTAL: 0.3 mg/dL (ref 0.2–1.2)
BUN: 14 mg/dL (ref 6–23)
CALCIUM: 9 mg/dL (ref 8.4–10.5)
CO2: 25 meq/L (ref 19–32)
CREATININE: 1.09 mg/dL (ref 0.40–1.20)
Chloride: 109 mEq/L (ref 96–112)
GFR: 61.35 mL/min (ref >60.00)
Glucose, Bld: 84 mg/dL (ref 70–99)
Potassium: 4 mEq/L (ref 3.5–5.1)
Sodium: 143 mEq/L (ref 135–145)
TOTAL PROTEIN: 6.9 g/dL (ref 6.0–8.3)

## 2015-04-23 LAB — CBC WITH DIFFERENTIAL/PLATELET
BASOS PCT: 0.5 % (ref 0.0–3.0)
Basophils Absolute: 0 10*3/uL (ref 0.0–0.1)
EOS PCT: 3.2 % (ref 0.0–5.0)
Eosinophils Absolute: 0.2 10*3/uL (ref 0.0–0.7)
HCT: 39.5 % (ref 36.0–46.0)
Hemoglobin: 13 g/dL (ref 12.0–15.0)
Lymphocytes Relative: 42.4 % (ref 12.0–46.0)
Lymphs Abs: 2.2 10*3/uL (ref 0.7–4.0)
MCHC: 32.9 g/dL (ref 30.0–36.0)
MCV: 83.6 fl (ref 78.0–100.0)
MONO ABS: 0.4 10*3/uL (ref 0.1–1.0)
Monocytes Relative: 8.5 % (ref 3.0–12.0)
NEUTROS ABS: 2.3 10*3/uL (ref 1.4–7.7)
NEUTROS PCT: 45.4 % (ref 43.0–77.0)
Platelets: 194 10*3/uL (ref 150.0–400.0)
RBC: 4.73 Mil/uL (ref 3.87–5.11)
RDW: 16 % — AB (ref 11.5–15.5)
WBC: 5.2 10*3/uL (ref 4.0–10.5)

## 2015-04-23 LAB — LIPID PANEL
CHOL/HDL RATIO: 4
CHOLESTEROL: 168 mg/dL (ref 0–200)
HDL: 44.2 mg/dL (ref >39.00)
LDL CALC: 111 mg/dL — AB (ref 0–99)
NonHDL: 124.15
TRIGLYCERIDES: 66 mg/dL (ref 0.0–149.0)
VLDL: 13.2 mg/dL (ref 0.0–40.0)

## 2015-04-23 LAB — TSH: TSH: 2.05 u[IU]/mL (ref 0.35–4.50)

## 2015-04-24 ENCOUNTER — Encounter: Payer: Self-pay | Admitting: Internal Medicine

## 2015-05-01 NOTE — Telephone Encounter (Signed)
Unread mychart message mailed to patient 

## 2016-03-30 ENCOUNTER — Encounter: Payer: Self-pay | Admitting: Internal Medicine

## 2016-03-30 ENCOUNTER — Ambulatory Visit (INDEPENDENT_AMBULATORY_CARE_PROVIDER_SITE_OTHER): Payer: BC Managed Care – PPO | Admitting: Internal Medicine

## 2016-03-30 ENCOUNTER — Other Ambulatory Visit (HOSPITAL_COMMUNITY)
Admission: RE | Admit: 2016-03-30 | Discharge: 2016-03-30 | Disposition: A | Discharge status: Home / Self Care | Payer: BC Managed Care – PPO | Source: Ambulatory Visit | Attending: Internal Medicine | Admitting: Internal Medicine

## 2016-03-30 VITALS — BP 130/78 | HR 86 | Temp 98.4°F | Ht 60.0 in | Wt 147.8 lb

## 2016-03-30 DIAGNOSIS — Z0000 Encounter for general adult medical examination without abnormal findings: Secondary | ICD-10-CM | POA: Diagnosis not present

## 2016-03-30 DIAGNOSIS — R635 Abnormal weight gain: Secondary | ICD-10-CM

## 2016-03-30 DIAGNOSIS — Z1151 Encounter for screening for human papillomavirus (HPV): Secondary | ICD-10-CM | POA: Insufficient documentation

## 2016-03-30 DIAGNOSIS — Z13220 Encounter for screening for lipoid disorders: Secondary | ICD-10-CM | POA: Diagnosis not present

## 2016-03-30 DIAGNOSIS — R5382 Chronic fatigue, unspecified: Secondary | ICD-10-CM

## 2016-03-30 NOTE — Progress Notes (Signed)
Pre-visit discussion using our clinic review tool. No additional management support is needed unless otherwise documented below in the visit note.  

## 2016-03-30 NOTE — Assessment & Plan Note (Signed)
Not sleeping as well.  Discussed with her today.  Discussed possible sleep apnea.  She desires no further testing at this time.  Will notify me if she changes her mind or if symptoms persist.

## 2016-03-30 NOTE — Addendum Note (Signed)
Addended by: Francella Solian on: 03/30/2016 09:15 AM   Modules accepted: Orders

## 2016-03-30 NOTE — Assessment & Plan Note (Signed)
Physical today 03/30/16.  PAP 03/30/16.

## 2016-03-30 NOTE — Progress Notes (Signed)
Patient ID: Holly Oliver, female   DOB: [birth year], 36 y.o.   MRN: [id number]   Subjective:    Patient ID: Holly Oliver, female    DOB: 12-05-1980, 36 y.o.   MRN: [id number]  HPI  Patient here for her physical exam.  She reports she has just changed jobs.  Increased stress, but job is going well.  She is not sleeping well.  Has no problems falling asleep, but does have trouble staying asleep.  Does snore. Saw ENT.  Needs tonsils removed.  The problem with sleep just started within the last few weeks.  They have moved.  New mattress.  Wants to monitor for now.  Desires no further testing or intervention at this time.  Eating.  No nausea or vomiting. Bowels moving.  Period - last - end of January.     Past Medical History:  Diagnosis Date  . History of chicken pox    Past Surgical History:  Procedure Laterality Date  . CESAREAN SECTION  11/02/09   Family History  Problem Relation Age of Onset  . Hypertension Mother   . Hyperlipidemia Mother   . Hyperlipidemia Father   . Lung cancer Maternal Grandfather   . Prostate cancer Maternal Grandfather   . Breast cancer Maternal Aunt     great aunt  . Diabetes Maternal Aunt    Social History   Social History  . Marital status: Married    Spouse name: N/A  . Number of children: 1  . Years of education: N/A   Occupational History  . [employer]   Social History Main Topics  . Smoking status: Never Smoker  . Smokeless tobacco: Never Used  . Alcohol use No  . Drug use: No  . Sexual activity: Not Asked   Other Topics Concern  . None   Social History Narrative  . None    No outpatient encounter prescriptions on file as of 03/30/2016.   No facility-administered encounter medications on file as of 03/30/2016.     Review of Systems  Constitutional: Negative for appetite change.       Not watching her diet as well.  Has gained weight.    HENT: Negative for congestion and sinus pressure.   Eyes: Negative for pain and  visual disturbance.  Respiratory: Negative for cough, chest tightness and shortness of breath.   Cardiovascular: Negative for chest pain, palpitations and leg swelling.  Gastrointestinal: Negative for abdominal pain, diarrhea, nausea and vomiting.  Genitourinary: Negative for difficulty urinating, dysuria and frequency.  Musculoskeletal: Negative for back pain and joint swelling.  Skin: Negative for color change and rash.  Neurological: Negative for dizziness, light-headedness and headaches.  Hematological: Negative for adenopathy. Does not bruise/bleed easily.  Psychiatric/Behavioral: Negative for agitation and dysphoric mood.       Objective:     Blood pressure rechecked by me:  112/76  Physical Exam  Constitutional: She is oriented to person, place, and time. She appears well-developed and well-nourished. No distress.  HENT:  Nose: Nose normal.  Mouth/Throat: Oropharynx is clear and moist.  Eyes: Right eye exhibits no discharge. Left eye exhibits no discharge. No scleral icterus.  Neck: Neck supple. No thyromegaly present.  Cardiovascular: Normal rate and regular rhythm.   Pulmonary/Chest: Breath sounds normal. No accessory muscle usage. No tachypnea. No respiratory distress. She has no decreased breath sounds. She has no wheezes. She has no rhonchi. Right breast exhibits no inverted nipple, no mass, no nipple discharge and no tenderness (no  axillary adenopathy). Left breast exhibits no inverted nipple, no mass, no nipple discharge and no tenderness (no axilarry adenopathy).  Abdominal: Soft. Bowel sounds are normal. There is no tenderness.  Genitourinary:  Genitourinary Comments: Normal external genitalia.  Vaginal vault without lesions.  Cervix identified.  Pap smear performed.  Could not appreciate any adnexal masses or tenderness.    Musculoskeletal: She exhibits no edema or tenderness.  Lymphadenopathy:    She has no cervical adenopathy.  Neurological: She is alert and  oriented to person, place, and time.  Skin: Skin is warm. No rash noted. No erythema.  Psychiatric: She has a normal mood and affect. Her behavior is normal.    BP 130/78 (BP Location: Left Arm, Patient Position: Sitting, Cuff Size: Large)   Pulse 86   Temp 98.4 F (36.9 C) (Oral)   Ht 5' (1.524 m)   Wt 147 lb 12.8 oz (67 kg)   LMP 03/10/2016   SpO2 99%   BMI 28.87 kg/m  Wt Readings from Last 3 Encounters:  03/30/16 147 lb 12.8 oz (67 kg)  03/29/15 140 lb 6 oz (63.7 kg)  07/24/13 133 lb 8 oz (60.6 kg)     Lab Results  Component Value Date   WBC 5.2 04/23/2015   HGB 13.0 04/23/2015   HCT 39.5 04/23/2015   PLT 194.0 04/23/2015   GLUCOSE 84 04/23/2015   CHOL 168 04/23/2015   TRIG 66.0 04/23/2015   HDL 44.20 04/23/2015   LDLCALC 111 (H) 04/23/2015   ALT 10 04/23/2015   AST 15 04/23/2015   NA 143 04/23/2015   K 4.0 04/23/2015   CL 109 04/23/2015   CREATININE 1.09 04/23/2015   BUN 14 04/23/2015   CO2 25 04/23/2015   TSH 2.05 04/23/2015       Assessment & Plan:   Problem List Items Addressed This Visit    Fatigue    Not sleeping as well.  Discussed with her today.  Discussed possible sleep apnea.  She desires no further testing at this time.  Will notify me if she changes her mind or if symptoms persist.        Relevant Orders   CBC with Differential/Platelet   Comprehensive metabolic panel   TSH   Health care maintenance    Physical today 03/30/16.  PAP 03/30/16.       Weight gain    Discussed diet and exercise.  Follow.         Other Visit Diagnoses    Screening cholesterol level    -  Primary   Relevant Orders   Lipid panel       Einar Pheasant, MD

## 2016-03-30 NOTE — Assessment & Plan Note (Signed)
Discussed diet and exercise.  Follow.  

## 2016-03-31 LAB — CYTOLOGY - PAP
DIAGNOSIS: NEGATIVE
HPV (WINDOPATH): NOT DETECTED

## 2016-04-07 ENCOUNTER — Other Ambulatory Visit: Payer: BC Managed Care – PPO

## 2017-01-25 ENCOUNTER — Other Ambulatory Visit: Payer: Self-pay

## 2017-01-25 ENCOUNTER — Encounter: Payer: Self-pay | Admitting: *Deleted

## 2017-01-28 NOTE — Discharge Instructions (Signed)
T & A INSTRUCTION SHEET - [facility] °[facility] ° °CREIGHTON VAUGHT, MD °PAUL H. JUENGEL, MD  °P. SCOTT BENNETT °CHAPMAN MCQUEEN, MD ° °[address] [place], [place] [address] TEL. [phone] °[address] [place] [address] [phone] ° °INFORMATION SHEET FOR A TONSILLECTOMY AND ADENDOIDECTOMY ° °About Your Tonsils and Adenoids ° The tonsils and adenoids are normal body tissues that are part of our immune system.  They normally help to protect us against diseases that may enter our mouth and nose.  However, sometimes the tonsils and/or adenoids become too large and obstruct our breathing, especially at night. °  ° If either of these things happen it helps to remove the tonsils and adenoids in order to become healthier. The operation to remove the tonsils and adenoids is called a tonsillectomy and adenoidectomy. ° °The Location of Your Tonsils and Adenoids ° The tonsils are located in the back of the throat on both side and sit in a cradle of muscles. The adenoids are located in the roof of the mouth, behind the nose, and closely associated with the opening of the Eustachian tube to the ear. ° °Surgery on Tonsils and Adenoids ° A tonsillectomy and adenoidectomy is a short operation which takes about thirty minutes.  This includes being put to sleep and being awakened.  Tonsillectomies and adenoidectomies are performed at [facility] and may require observation period in the recovery room prior to going home. ° °Following the Operation for a Tonsillectomy ° A cautery machine is used to control bleeding.  Bleeding from a tonsillectomy and adenoidectomy is minimal and postoperatively the risk of bleeding is approximately four percent, although this rarely life threatening. ° ° ° °After your tonsillectomy and adenoidectomy post-op care at home: ° °1. Our patients are able to go home the same day.  You may be given prescriptions for pain  medications and antibiotics, if indicated. °2. It is extremely important to remember that fluid intake is of utmost importance after a tonsillectomy.  The amount that you drink must be maintained in the postoperative period.  A good indication of whether a child is getting enough fluid is whether his/her urine output is constant.  As long as children are urinating or wetting their diaper every 6 - 8 hours this is usually enough fluid intake.   °3. Although rare, this is a risk of some bleeding in the first ten days after surgery.  This is usually occurs between day five and nine postoperatively.  This risk of bleeding is approximately four percent.  If you or your child should have any bleeding you should remain calm and notify our office or go directly to the Emergency Room at [facility] where they will contact us. Our doctors are available seven days a week for notification.  We recommend sitting up quietly in a chair, place an ice pack on the front of the neck and spitting out the blood gently until we are able to contact you.  Adults should gargle gently with ice water and this may help stop the bleeding.  If the bleeding does not stop after a short time, i.e. 10 to 15 minutes, or seems to be increasing again, please contact us or go to the hospital.   °4. It is common for the pain to be worse at 5 - 7 days postoperatively.  This occurs because the “scab” is peeling off and the mucous membrane (skin of   the throat) is growing back where the tonsils were.   °5. It is common for a low-grade fever, less than 102, during the first week after a tonsillectomy and adenoidectomy.  It is usually due to not drinking enough liquids, and we suggest your use liquid Tylenol or the pain medicine with Tylenol prescribed in order to keep your temperature below 102.  Please follow the directions on the back of the bottle. °6. Do not take aspirin or any products that contain aspirin such as Bufferin, Anacin,  Ecotrin, aspirin gum, Goodies, BC headache powders, etc., after a T&A because it can promote bleeding.  Please check with our office before administering any other medication that may been prescribed by other doctors during the two week post-operative period. °7. If you happen to look in the mirror or into your child’s mouth you will see white/gray patches on the back of the throat.  This is what a scab looks like in the mouth and is normal after having a T&A.  It will disappear once the tonsil area heals completely. However, it may cause a noticeable odor, and this too will disappear with time.     °8. You or your child may experience ear pain after having a T&A.  This is called referred pain and comes from the throat, but it is felt in the ears.  Ear pain is quite common and expected.  It will usually go away after ten days.  There is usually nothing wrong with the ears, and it is primarily due to the healing area stimulating the nerve to the ear that runs along the side of the throat.  Use either the prescribed pain medicine or Tylenol as needed.  °9. The throat tissues after a tonsillectomy are obviously sensitive.  Smoking around children who have had a tonsillectomy significantly increases the risk of bleeding.  DO NOT SMOKE!  ° °General Anesthesia, Adult, Care After °These instructions provide you with information about caring for yourself after your procedure. Your health care provider may also give you more specific instructions. Your treatment has been planned according to current medical practices, but problems sometimes occur. Call your health care provider if you have any problems or questions after your procedure. °What can I expect after the procedure? °After the procedure, it is common to have: °· Vomiting. °· A sore throat. °· Mental slowness. ° °It is common to feel: °· Nauseous. °· Cold or shivery. °· Sleepy. °· Tired. °· Sore or achy, even in parts of your body where you did not have  surgery. ° °Follow these instructions at home: °For at least 24 hours after the procedure: °· Do not: °? Participate in activities where you could fall or become injured. °? Drive. °? Use heavy machinery. °? Drink alcohol. °? Take sleeping pills or medicines that cause drowsiness. °? Make important decisions or sign legal documents. °? Take care of children on your own. °· Rest. °Eating and drinking °· If you vomit, drink water, juice, or soup when you can drink without vomiting. °· Drink enough fluid to keep your urine clear or pale yellow. °· Make sure you have little or no nausea before eating solid foods. °· Follow the diet recommended by your health care provider. °General instructions °· Have a responsible adult stay with you until you are awake and alert. °· Return to your normal activities as told by your health care provider. Ask your health care provider what activities are safe for you. °· Take over-the-counter and   prescription medicines only as told by your health care provider. °· If you smoke, do not smoke without supervision. °· Keep all follow-up visits as told by your health care provider. This is important. °Contact a health care provider if: °· You continue to have nausea or vomiting at home, and medicines are not helpful. °· You cannot drink fluids or start eating again. °· You cannot urinate after 8-12 hours. °· You develop a skin rash. °· You have fever. °· You have increasing redness at the site of your procedure. °Get help right away if: °· You have difficulty breathing. °· You have chest pain. °· You have unexpected bleeding. °· You feel that you are having a life-threatening or urgent problem. °This information is not intended to replace advice given to you by your health care provider. Make sure you discuss any questions you have with your health care provider. °Document Released: 05/04/2000 Document Revised: 07/01/2015 Document Reviewed: 01/10/2015 °Elsevier Interactive Patient Education  © 2018 [employer] ° °

## 2017-01-29 ENCOUNTER — Ambulatory Visit: Payer: BC Managed Care – PPO | Admitting: Anesthesiology

## 2017-01-29 ENCOUNTER — Encounter
Admission: RE | Disposition: A | Discharge status: Home / Self Care | Payer: Self-pay | Source: Ambulatory Visit | Attending: Unknown Physician Specialty

## 2017-01-29 ENCOUNTER — Ambulatory Visit
Admission: RE | Admit: 2017-01-29 | Discharge: 2017-01-29 | Disposition: A | Discharge status: Home / Self Care | Payer: BC Managed Care – PPO | Source: Ambulatory Visit | Attending: Unknown Physician Specialty | Admitting: Unknown Physician Specialty

## 2017-01-29 DIAGNOSIS — J3501 Chronic tonsillitis: Secondary | ICD-10-CM | POA: Insufficient documentation

## 2017-01-29 HISTORY — PX: TONSILLECTOMY: SHX5217

## 2017-01-29 HISTORY — DX: Family history of other specified conditions: Z84.89

## 2017-01-29 HISTORY — DX: Cardiac murmur, unspecified: R01.1

## 2017-01-29 SURGERY — TONSILLECTOMY
Anesthesia: General | Site: Throat | Wound class: Clean Contaminated

## 2017-01-29 MED ORDER — DEXAMETHASONE SODIUM PHOSPHATE 4 MG/ML IJ SOLN
INTRAMUSCULAR | Status: DC | PRN
  Administered 2017-01-29: 10 mg via INTRAVENOUS

## 2017-01-29 MED ORDER — FENTANYL CITRATE (PF) 100 MCG/2ML IJ SOLN
25.0000 ug | INTRAMUSCULAR | Status: DC | PRN
  Administered 2017-01-29: 50 ug via INTRAVENOUS
  Administered 2017-01-29: 25 ug via INTRAVENOUS

## 2017-01-29 MED ORDER — ONDANSETRON HCL 4 MG/2ML IJ SOLN
INTRAMUSCULAR | Status: DC | PRN
  Administered 2017-01-29: 4 mg via INTRAVENOUS

## 2017-01-29 MED ORDER — PROPOFOL 10 MG/ML IV BOLUS
INTRAVENOUS | Status: DC | PRN
  Administered 2017-01-29: 150 mg via INTRAVENOUS

## 2017-01-29 MED ORDER — PROMETHAZINE HCL 25 MG/ML IJ SOLN: 6.2500 mg | INTRAMUSCULAR | Status: DC | PRN

## 2017-01-29 MED ORDER — SCOPOLAMINE 1 MG/3DAYS TD PT72
1.0000 | MEDICATED_PATCH | TRANSDERMAL | Status: DC
  Administered 2017-01-29: 1.5 mg via TRANSDERMAL

## 2017-01-29 MED ORDER — MIDAZOLAM HCL 5 MG/5ML IJ SOLN
INTRAMUSCULAR | Status: DC | PRN
  Administered 2017-01-29: 2 mg via INTRAVENOUS

## 2017-01-29 MED ORDER — OXYCODONE HCL 5 MG/5ML PO SOLN
5.0000 mg | Freq: Once | ORAL | Status: AC | PRN
  Administered 2017-01-29: 5 mg via ORAL

## 2017-01-29 MED ORDER — LACTATED RINGERS IV SOLN: 500.0000 mL | INTRAVENOUS | Status: DC

## 2017-01-29 MED ORDER — GLYCOPYRROLATE 0.2 MG/ML IJ SOLN
INTRAMUSCULAR | Status: DC | PRN
  Administered 2017-01-29: 0.1 mg via INTRAVENOUS

## 2017-01-29 MED ORDER — HYDROMORPHONE HCL 1 MG/ML IJ SOLN
0.5000 mg | INTRAMUSCULAR | Status: DC | PRN
  Administered 2017-01-29: 0.5 mg via INTRAVENOUS

## 2017-01-29 MED ORDER — FENTANYL CITRATE (PF) 100 MCG/2ML IJ SOLN
INTRAMUSCULAR | Status: DC | PRN
  Administered 2017-01-29 (×2): 50 ug via INTRAVENOUS

## 2017-01-29 MED ORDER — OXYCODONE HCL 5 MG PO TABS: 5.0000 mg | ORAL_TABLET | Freq: Once | ORAL | Status: AC | PRN

## 2017-01-29 MED ORDER — LACTATED RINGERS IV SOLN
INTRAVENOUS | Status: DC
  Administered 2017-01-29: 08:00:00 via INTRAVENOUS

## 2017-01-29 MED ORDER — LIDOCAINE HCL (CARDIAC) 20 MG/ML IV SOLN
INTRAVENOUS | Status: DC | PRN
  Administered 2017-01-29: 40 mg via INTRAVENOUS

## 2017-01-29 MED ORDER — KETOROLAC TROMETHAMINE 30 MG/ML IJ SOLN
30.0000 mg | Freq: Once | INTRAMUSCULAR | Status: AC
  Administered 2017-01-29: 30 mg via INTRAVENOUS

## 2017-01-29 MED ORDER — ACETAMINOPHEN 10 MG/ML IV SOLN
1000.0000 mg | Freq: Once | INTRAVENOUS | Status: AC
  Administered 2017-01-29: 1000 mg via INTRAVENOUS

## 2017-01-29 MED ORDER — SUCCINYLCHOLINE CHLORIDE 20 MG/ML IJ SOLN
INTRAMUSCULAR | Status: DC | PRN
  Administered 2017-01-29: 100 mg via INTRAVENOUS

## 2017-01-29 MED ORDER — HYDROCODONE-ACETAMINOPHEN 7.5-325 MG/15ML PO SOLN: 15.0000 mL | Freq: Four times a day (QID) | ORAL | 0 refills | Status: DC | PRN

## 2017-01-29 MED ORDER — BUPIVACAINE HCL (PF) 0.5 % IJ SOLN
INTRAMUSCULAR | Status: DC | PRN
  Administered 2017-01-29: 9 mL

## 2017-01-29 SURGICAL SUPPLY — 16 items
CANISTER SUCT 1200ML W/VALVE (MISCELLANEOUS) ×2 IMPLANT
COAG SUCT 10F 3.5MM HAND CTRL (MISCELLANEOUS) ×2 IMPLANT
DRAPE HEAD BAR (DRAPES) ×2 IMPLANT
ELECT CAUTERY BLADE TIP 2.5 (TIP) ×2
ELECTRODE CAUTERY BLDE TIP 2.5 (TIP) ×1 IMPLANT
GLOVE BIO SURGEON STRL SZ7.5 (GLOVE) ×2 IMPLANT
HANDLE SUCTION POOLE (INSTRUMENTS) ×1 IMPLANT
KIT ROOM TURNOVER OR (KITS) ×2 IMPLANT
NEEDLE HYPO 25GX1X1/2 BEV (NEEDLE) ×2 IMPLANT
NS IRRIG 500ML POUR BTL (IV SOLUTION) ×2 IMPLANT
PACK TONSIL/ADENOIDS (PACKS) ×2 IMPLANT
PAD GROUND ADULT SPLIT (MISCELLANEOUS) ×2 IMPLANT
PENCIL ELECTRO HAND CTR (MISCELLANEOUS) ×2 IMPLANT
STRAP BODY AND KNEE 60X3 (MISCELLANEOUS) ×2 IMPLANT
SUCTION POOLE HANDLE (INSTRUMENTS) ×2
SYR 5ML LL (SYRINGE) ×2 IMPLANT

## 2017-01-29 NOTE — Anesthesia Postprocedure Evaluation (Signed)
Anesthesia Post Note  Patient: Holly Oliver  Procedure(s) Performed: TONSILLECTOMY (N/A Throat)  Patient location during evaluation: PACU Anesthesia Type: General Level of consciousness: awake Pain management: pain level controlled Vital Signs Assessment: post-procedure vital signs reviewed and stable Respiratory status: respiratory function stable Cardiovascular status: stable Postop Assessment: no signs of nausea or vomiting Anesthetic complications: no    Veda Canning

## 2017-01-29 NOTE — Op Note (Signed)
PREOPERATIVE DIAGNOSIS:  CHRONIC TONSILLITIS  POSTOPERATIVE DIAGNOSIS:  Chronic Tonsillitis  OPERATION:  Tonsillectomy.  SURGEON:  Roena Malady, MD  ANESTHESIA:  General endotracheal.  OPERATIVE FINDINGS:  Large tonsils.  DESCRIPTION OF THE PROCEDURE: Seniah Lawrence was identified in the holding area and taken to the operating room and placed in the supine position.  After general endotracheal anesthesia, the table was turned 45 degrees and the patient was draped in the usual fashion for a tonsillectomy.  A mouth gag was inserted into the oral cavity.  There were large tonsils.  Beginning on the left-hand side a tenaculum was used to grasp the tonsil and the Bovie cautery was used to dissect it free from the fossa.  In a similar fashion, the right tonsil was removed.  Meticulous hemostasis was achieved using the Bovie cautery.  With both tonsils removed and no active bleeding, 0.5% plain Marcaine was used to inject the anterior and posterior tonsillar pillars bilaterally.  A total of 65ml was used.  The patient tolerated the procedure well and was awakened in the operating room and taken to the recovery room in stable condition.   CULTURES:  None.  SPECIMENS:  Tonsils.  ESTIMATED BLOOD LOSS:  Less than 10 ml.  Carlyle Achenbach T  01/29/2017  9:17 AM

## 2017-01-29 NOTE — Transfer of Care (Signed)
Immediate Anesthesia Transfer of Care Note  Patient: Holly Oliver  Procedure(s) Performed: TONSILLECTOMY (N/A Throat)  Patient Location: PACU  Anesthesia Type: General  Level of Consciousness: awake, alert  and patient cooperative  Airway and Oxygen Therapy: Patient Spontanous Breathing and Patient connected to supplemental oxygen  Post-op Assessment: Post-op Vital signs reviewed, Patient's Cardiovascular Status Stable, Respiratory Function Stable, Patent Airway and No signs of Nausea or vomiting  Post-op Vital Signs: Reviewed and stable  Complications: No apparent anesthesia complications

## 2017-01-29 NOTE — Anesthesia Procedure Notes (Signed)
Procedure Name: Intubation Date/Time: 01/29/2017 9:03 AM Performed by: Mayme Genta, CRNA Pre-anesthesia Checklist: Patient identified, Emergency Drugs available, Suction available, Patient being monitored and Timeout performed Patient Re-evaluated:Patient Re-evaluated prior to induction Oxygen Delivery Method: Circle system utilized Preoxygenation: Pre-oxygenation with 100% oxygen Induction Type: IV induction Ventilation: Mask ventilation without difficulty Laryngoscope Size: Miller and 2 Grade View: Grade I Tube type: Oral Rae Tube size: 7.0 mm Number of attempts: 1 Placement Confirmation: ETT inserted through vocal cords under direct vision,  positive ETCO2 and breath sounds checked- equal and bilateral Tube secured with: Tape Dental Injury: Teeth and Oropharynx as per pre-operative assessment

## 2017-01-29 NOTE — Anesthesia Preprocedure Evaluation (Signed)
Anesthesia Evaluation  Patient identified by MRN, date of birth, ID band Patient awake    Reviewed: Allergy & Precautions, NPO status   Airway Mallampati: II  TM Distance: >3 FB     Dental no notable dental hx.    Pulmonary neg pulmonary ROS,    breath sounds clear to auscultation       Cardiovascular negative cardio ROS   Rhythm:Regular Rate:Normal     Neuro/Psych    GI/Hepatic negative GI ROS,   Endo/Other  negative endocrine ROS  Renal/GU      Musculoskeletal   Abdominal   Peds  Hematology   Anesthesia Other Findings   Reproductive/Obstetrics                            Anesthesia Physical Anesthesia Plan  ASA: II  Anesthesia Plan: General   Post-op Pain Management:    Induction:   PONV Risk Score and Plan: Ondansetron, Dexamethasone and Scopolamine patch - Pre-op  Airway Management Planned: Oral ETT  Additional Equipment:   Intra-op Plan:   Post-operative Plan:   Informed Consent: I have reviewed the patients History and Physical, chart, labs and discussed the procedure including the risks, benefits and alternatives for the proposed anesthesia with the patient or authorized representative who has indicated his/her understanding and acceptance.     Plan Discussed with: CRNA  Anesthesia Plan Comments:        Anesthesia Quick Evaluation

## 2017-01-29 NOTE — H&P (Signed)
[name] history has been reviewed, patient examined, no change in status, stable for surgery.  Questions were answered to the patients satisfaction.  

## 2017-02-03 LAB — SURGICAL PATHOLOGY

## 2017-04-01 ENCOUNTER — Encounter: Payer: BC Managed Care – PPO | Admitting: Internal Medicine

## 2017-04-01 DIAGNOSIS — Z0289 Encounter for other administrative examinations: Secondary | ICD-10-CM

## 2017-07-08 ENCOUNTER — Ambulatory Visit (INDEPENDENT_AMBULATORY_CARE_PROVIDER_SITE_OTHER): Payer: BC Managed Care – PPO | Admitting: Internal Medicine

## 2017-07-08 ENCOUNTER — Encounter: Payer: Self-pay | Admitting: Internal Medicine

## 2017-07-08 VITALS — BP 112/74 | HR 74 | Temp 98.5°F | Resp 18 | Ht 60.0 in | Wt 150.4 lb

## 2017-07-08 DIAGNOSIS — Z13220 Encounter for screening for lipoid disorders: Secondary | ICD-10-CM | POA: Diagnosis not present

## 2017-07-08 DIAGNOSIS — R300 Dysuria: Secondary | ICD-10-CM | POA: Diagnosis not present

## 2017-07-08 DIAGNOSIS — Z0000 Encounter for general adult medical examination without abnormal findings: Secondary | ICD-10-CM

## 2017-07-08 DIAGNOSIS — R109 Unspecified abdominal pain: Secondary | ICD-10-CM | POA: Insufficient documentation

## 2017-07-08 LAB — URINALYSIS, ROUTINE W REFLEX MICROSCOPIC
Bilirubin Urine: NEGATIVE
Ketones, ur: 15 — AB
Leukocytes, UA: NEGATIVE
NITRITE: NEGATIVE
Specific Gravity, Urine: >=1.03 — AB (ref 1.000–1.030)
Total Protein, Urine: NEGATIVE
Urine Glucose: NEGATIVE
Urobilinogen, UA: 0.2 (ref 0.0–1.0)
pH: 5.5 (ref 5.0–8.0)

## 2017-07-08 NOTE — Progress Notes (Signed)
Patient ID: Holly Oliver, female   DOB: February 19, 1980, 37 y.o.   MRN: [id number]   Subjective:    Patient ID: Holly Oliver, female    DOB: 1980-12-25, 37 y.o.   MRN: [id number]  HPI  Patient here for her physical exam.  Has recently been diagnosed with uti.  On cipro.  States that symptoms started over the previous weekend.  Noticed some stinging with end urination.  She started increased her water intake and started drinking cranberry juice.  Noticed some pain in her stomach and right lower back.  Urine cloudy.  To UC.  Diagnosed with UTI.  Treated with cipro. States she is feeling better.  No increased back pain now.  No lower abdominal pain currently.  Wants urine rechecked.  No nausea or vomiting.  Eating.  No fever.  Breathing stable.     Past Medical History:  Diagnosis Date  . Family history of adverse reaction to anesthesia    Maternal Grandmother -PONV  . Heart murmur    followed by PCP  . History of chicken pox    Past Surgical History:  Procedure Laterality Date  . CESAREAN SECTION  11/02/09  . TONSILLECTOMY N/A 01/29/2017   Procedure: TONSILLECTOMY;  Surgeon: Beverly Gust, MD;  Location: [facility];  Service: ENT;  Laterality: N/A;   Family History  Problem Relation Age of Onset  . Hypertension Mother   . Hyperlipidemia Mother   . Hyperlipidemia Father   . Lung cancer Maternal Grandfather   . Prostate cancer Maternal Grandfather   . Breast cancer Maternal Aunt        great aunt  . Diabetes Maternal Aunt    Social History   Socioeconomic History  . Marital status: Married    Spouse name: Not on file  . Number of children: 1  . Years of education: Not on file  . Highest education level: Not on file  Occupational History  . Occupation: Surveyor, quantity: [employer]  Social Needs  . Financial resource strain: Not on file  . Food insecurity:    Worry: Not on file    Inability: Not on file  . Transportation needs:    Medical: Not on file   Non-medical: Not on file  Tobacco Use  . Smoking status: Never Smoker  . Smokeless tobacco: Never Used  Substance and Sexual Activity  . Alcohol use: Yes    Alcohol/week: 0.0 oz    Comment: Holidays  . Drug use: No  . Sexual activity: Not on file  Lifestyle  . Physical activity:    Days per week: Not on file    Minutes per session: Not on file  . Stress: Not on file  Relationships  . Social connections:    Talks on phone: Not on file    Gets together: Not on file    Attends religious service: Not on file    Active member of club or organization: Not on file    Attends meetings of clubs or organizations: Not on file    Relationship status: Not on file  Other Topics Concern  . Not on file  Social History Narrative  . Not on file    Outpatient Encounter Medications as of 07/08/2017  Medication Sig  . [DISCONTINUED] HYDROcodone-acetaminophen (HYCET) 7.5-325 mg/15 ml solution Take 15 mLs by mouth 4 (four) times daily as needed for moderate pain.   No facility-administered encounter medications on file as of 07/08/2017.     Review of  Systems  Constitutional: Negative for appetite change and fever.  HENT: Negative for congestion and sinus pressure.   Eyes: Negative for pain and visual disturbance.  Respiratory: Negative for cough, chest tightness and shortness of breath.   Cardiovascular: Negative for chest pain, palpitations and leg swelling.  Gastrointestinal: Negative for diarrhea, nausea and vomiting.       No significant abdominal pain currently.    Genitourinary: Positive for dysuria. Negative for difficulty urinating, vaginal discharge and vaginal pain.  Musculoskeletal: Negative for joint swelling and myalgias.       Previous low back pain.  Better.   Skin: Negative for color change and rash.  Neurological: Negative for dizziness, light-headedness and headaches.  Hematological: Negative for adenopathy. Does not bruise/bleed easily.  Psychiatric/Behavioral: Negative  for agitation and dysphoric mood.       Objective:    Physical Exam  Constitutional: She is oriented to person, place, and time. She appears well-developed and well-nourished. No distress.  HENT:  Nose: Nose normal.  Mouth/Throat: Oropharynx is clear and moist.  Eyes: Right eye exhibits no discharge. Left eye exhibits no discharge. No scleral icterus.  Neck: Neck supple. No thyromegaly present.  Cardiovascular: Normal rate and regular rhythm.  Pulmonary/Chest: Breath sounds normal. No accessory muscle usage. No tachypnea. No respiratory distress. She has no decreased breath sounds. She has no wheezes. She has no rhonchi. Right breast exhibits no inverted nipple, no mass, no nipple discharge and no tenderness (no axillary adenopathy). Left breast exhibits no inverted nipple, no mass, no nipple discharge and no tenderness (no axilarry adenopathy).  Abdominal: Soft. Bowel sounds are normal. There is no tenderness.  Genitourinary:  Genitourinary Comments: Not performed.   Musculoskeletal: She exhibits no edema or tenderness.  No CVA tenderness.    Lymphadenopathy:    She has no cervical adenopathy.  Neurological: She is alert and oriented to person, place, and time.  Skin: No rash noted. No erythema.  Psychiatric: She has a normal mood and affect. Her behavior is normal.    BP 112/74 (BP Location: Left Arm, Patient Position: Sitting, Cuff Size: Normal)   Pulse 74   Temp 98.5 F (36.9 C) (Oral)   Resp 18   Ht 5' (1.524 m)   Wt 150 lb 6.4 oz (68.2 kg)   SpO2 99%   BMI 29.37 kg/m  Wt Readings from Last 3 Encounters:  07/08/17 150 lb 6.4 oz (68.2 kg)  01/29/17 152 lb (68.9 kg)  03/30/16 147 lb 12.8 oz (67 kg)     Lab Results  Component Value Date   WBC 9.0 07/08/2017   HGB 13.8 07/08/2017   HCT 41.4 07/08/2017   PLT 196.0 07/08/2017   GLUCOSE 73 07/08/2017   CHOL 175 07/08/2017   TRIG 66.0 07/08/2017   HDL 38.70 (L) 07/08/2017   LDLCALC 123 (H) 07/08/2017   ALT 22  07/08/2017   AST 23 07/08/2017   NA 140 07/08/2017   K 4.0 07/08/2017   CL 106 07/08/2017   CREATININE 1.17 07/08/2017   BUN 12 07/08/2017   CO2 25 07/08/2017   TSH 3.23 07/08/2017       Assessment & Plan:   Problem List Items Addressed This Visit    Abdominal pressure    Recently diagnosed with uti.  Treated with cipro.  Symptoms have improved.  Some residual.  Wants urine rechecked today to confirm no infection.  Follow.  No vaginal symptoms.       Relevant Orders   CBC with  Differential/Platelet (Completed)   Hepatic function panel (Completed)   Basic metabolic panel (Completed)   Health care maintenance    Physical today 07/08/17.  PAP 03/30/16 - negative with negative HPV.         Other Visit Diagnoses    Dysuria    -  Primary   Relevant Orders   Urinalysis, Routine w reflex microscopic (Completed)   Urine Culture (Completed)   Screening cholesterol level       Relevant Orders   Lipid panel (Completed)   TSH (Completed)   Routine general medical examination at a health care facility           Einar Pheasant, MD

## 2017-07-08 NOTE — Assessment & Plan Note (Signed)
Physical today 07/08/17.  PAP 03/30/16 - negative with negative HPV.

## 2017-07-09 ENCOUNTER — Telehealth: Payer: Self-pay | Admitting: Internal Medicine

## 2017-07-09 ENCOUNTER — Encounter: Payer: Self-pay | Admitting: Internal Medicine

## 2017-07-09 LAB — BASIC METABOLIC PANEL
BUN: 12 mg/dL (ref 6–23)
CALCIUM: 9.1 mg/dL (ref 8.4–10.5)
CO2: 25 meq/L (ref 19–32)
CREATININE: 1.17 mg/dL (ref 0.40–1.20)
Chloride: 106 mEq/L (ref 96–112)
GFR: 55.8 mL/min — AB (ref >60.00)
GLUCOSE: 73 mg/dL (ref 70–99)
Potassium: 4 mEq/L (ref 3.5–5.1)
Sodium: 140 mEq/L (ref 135–145)

## 2017-07-09 LAB — CBC WITH DIFFERENTIAL/PLATELET
BASOS ABS: 0.1 10*3/uL (ref 0.0–0.1)
Basophils Relative: 0.7 % (ref 0.0–3.0)
EOS PCT: 2.5 % (ref 0.0–5.0)
Eosinophils Absolute: 0.2 10*3/uL (ref 0.0–0.7)
HCT: 41.4 % (ref 36.0–46.0)
HEMOGLOBIN: 13.8 g/dL (ref 12.0–15.0)
LYMPHS ABS: 2.6 10*3/uL (ref 0.7–4.0)
Lymphocytes Relative: 29.3 % (ref 12.0–46.0)
MCHC: 33.2 g/dL (ref 30.0–36.0)
MCV: 90.2 fl (ref 78.0–100.0)
MONO ABS: 0.5 10*3/uL (ref 0.1–1.0)
Monocytes Relative: 5.6 % (ref 3.0–12.0)
NEUTROS PCT: 61.9 % (ref 43.0–77.0)
Neutro Abs: 5.6 10*3/uL (ref 1.4–7.7)
Platelets: 196 10*3/uL (ref 150.0–400.0)
RBC: 4.59 Mil/uL (ref 3.87–5.11)
RDW: 14 % (ref 11.5–15.5)
WBC: 9 10*3/uL (ref 4.0–10.5)

## 2017-07-09 LAB — HEPATIC FUNCTION PANEL
ALT: 22 U/L (ref 0–35)
AST: 23 U/L (ref 0–37)
Albumin: 4 g/dL (ref 3.5–5.2)
Alkaline Phosphatase: 63 U/L (ref 39–117)
BILIRUBIN DIRECT: 0 mg/dL (ref 0.0–0.3)
Total Bilirubin: 0.4 mg/dL (ref 0.2–1.2)
Total Protein: 7 g/dL (ref 6.0–8.3)

## 2017-07-09 LAB — LIPID PANEL
CHOL/HDL RATIO: 5
CHOLESTEROL: 175 mg/dL (ref 0–200)
HDL: 38.7 mg/dL — ABNORMAL LOW (ref >39.00)
LDL Cholesterol: 123 mg/dL — ABNORMAL HIGH (ref 0–99)
NonHDL: 136.04
Triglycerides: 66 mg/dL (ref 0.0–149.0)
VLDL: 13.2 mg/dL (ref 0.0–40.0)

## 2017-07-09 LAB — URINE CULTURE
MICRO NUMBER:: 90652546
Result:: NO GROWTH
SPECIMEN QUALITY:: ADEQUATE

## 2017-07-09 LAB — TSH: TSH: 3.23 u[IU]/mL (ref 0.35–4.50)

## 2017-07-09 NOTE — Telephone Encounter (Signed)
UTI culture still pending, patient aware.

## 2017-07-09 NOTE — Telephone Encounter (Signed)
Called pt advised walk in clinic

## 2017-07-09 NOTE — Telephone Encounter (Signed)
Please advise 

## 2017-07-09 NOTE — Telephone Encounter (Unsigned)
Copied from [place] [id number]. Topic: Quick Communication - See Telephone Encounter >> Jul 09, 2017  3:40 PM Neva Seat wrote: Pt is checking on the urinalysis results from yesterday.  Wanting to know if the antibiotic will need to be extended. Pt was stuffy yesterday at visit - today she has fever, chills, sinus pressure and teeth pain. Please call pt back to let her know on theses concerns.

## 2017-07-11 ENCOUNTER — Encounter: Payer: Self-pay | Admitting: Internal Medicine

## 2017-07-11 NOTE — Assessment & Plan Note (Signed)
Recently diagnosed with uti.  Treated with cipro.  Symptoms have improved.  Some residual.  Wants urine rechecked today to confirm no infection.  Follow.  No vaginal symptoms.

## 2017-07-12 NOTE — Telephone Encounter (Signed)
Spoke with patient on Friday. Advised that urine culture was still pending. Also advised that patient go to walk in clinic for evaluation of other symptoms. Pt agreed to comply

## 2018-07-11 ENCOUNTER — Encounter: Payer: Self-pay | Admitting: Internal Medicine

## 2018-07-11 MED ORDER — PENCICLOVIR 1 % EX CREA: 1.0000 "application " | TOPICAL_CREAM | Freq: Four times a day (QID) | CUTANEOUS | 0 refills | Status: DC | PRN

## 2018-07-11 NOTE — Telephone Encounter (Signed)
Pt has an appt on 6/3.

## 2018-07-11 NOTE — Telephone Encounter (Signed)
Please call pharmacy and see what options we have with her insurance.  Also, let pt know we are trying to get this straightened out.

## 2018-07-11 NOTE — Telephone Encounter (Signed)
rx sent in for denavir

## 2018-07-12 ENCOUNTER — Telehealth: Payer: Self-pay

## 2018-07-12 NOTE — Telephone Encounter (Signed)
Copied from [place] [id number]. Topic: General - Call Back - No Documentation >> Jul 12, 2018 10:51 AM Holly Oliver wrote: Reason for CRM:   Pt states she is returning a call about screening prior to her appointment tomorrow.  Pt states she will be available after noon.

## 2018-07-13 ENCOUNTER — Other Ambulatory Visit: Payer: Self-pay

## 2018-07-13 ENCOUNTER — Other Ambulatory Visit (HOSPITAL_COMMUNITY)
Admission: RE | Admit: 2018-07-13 | Discharge: 2018-07-13 | Disposition: A | Discharge status: Home / Self Care | Payer: BC Managed Care – PPO | Source: Ambulatory Visit | Attending: Internal Medicine | Admitting: Internal Medicine

## 2018-07-13 ENCOUNTER — Ambulatory Visit (INDEPENDENT_AMBULATORY_CARE_PROVIDER_SITE_OTHER): Payer: BC Managed Care – PPO | Admitting: Internal Medicine

## 2018-07-13 VITALS — BP 108/70 | HR 89 | Temp 98.4°F | Resp 16 | Wt 124.8 lb

## 2018-07-13 DIAGNOSIS — L989 Disorder of the skin and subcutaneous tissue, unspecified: Secondary | ICD-10-CM

## 2018-07-13 DIAGNOSIS — Z0000 Encounter for general adult medical examination without abnormal findings: Secondary | ICD-10-CM

## 2018-07-13 DIAGNOSIS — Z124 Encounter for screening for malignant neoplasm of cervix: Secondary | ICD-10-CM

## 2018-07-13 DIAGNOSIS — Z13220 Encounter for screening for lipoid disorders: Secondary | ICD-10-CM | POA: Diagnosis not present

## 2018-07-13 DIAGNOSIS — R6882 Decreased libido: Secondary | ICD-10-CM | POA: Diagnosis not present

## 2018-07-13 LAB — CBC WITH DIFFERENTIAL/PLATELET
Basophils Absolute: 0 10*3/uL (ref 0.0–0.1)
Basophils Relative: 0.5 % (ref 0.0–3.0)
Eosinophils Absolute: 0.1 10*3/uL (ref 0.0–0.7)
Eosinophils Relative: 1.4 % (ref 0.0–5.0)
HCT: 43.1 % (ref 36.0–46.0)
Hemoglobin: 14.2 g/dL (ref 12.0–15.0)
Lymphocytes Relative: 33.4 % (ref 12.0–46.0)
Lymphs Abs: 1.9 10*3/uL (ref 0.7–4.0)
MCHC: 33.1 g/dL (ref 30.0–36.0)
MCV: 90.7 fl (ref 78.0–100.0)
Monocytes Absolute: 0.4 10*3/uL (ref 0.1–1.0)
Monocytes Relative: 6.6 % (ref 3.0–12.0)
Neutro Abs: 3.3 10*3/uL (ref 1.4–7.7)
Neutrophils Relative %: 58.1 % (ref 43.0–77.0)
Platelets: 212 10*3/uL (ref 150.0–400.0)
RBC: 4.75 Mil/uL (ref 3.87–5.11)
RDW: 14.8 % (ref 11.5–15.5)
WBC: 5.7 10*3/uL (ref 4.0–10.5)

## 2018-07-13 LAB — COMPREHENSIVE METABOLIC PANEL
ALT: 10 U/L (ref 0–35)
AST: 13 U/L (ref 0–37)
Albumin: 4.1 g/dL (ref 3.5–5.2)
Alkaline Phosphatase: 51 U/L (ref 39–117)
BUN: 12 mg/dL (ref 6–23)
CO2: 27 mEq/L (ref 19–32)
Calcium: 9.3 mg/dL (ref 8.4–10.5)
Chloride: 105 mEq/L (ref 96–112)
Creatinine, Ser: 1.14 mg/dL (ref 0.40–1.20)
GFR: 53.79 mL/min — ABNORMAL LOW (ref >60.00)
Glucose, Bld: 84 mg/dL (ref 70–99)
Potassium: 4.5 mEq/L (ref 3.5–5.1)
Sodium: 140 mEq/L (ref 135–145)
Total Bilirubin: 0.5 mg/dL (ref 0.2–1.2)
Total Protein: 6.6 g/dL (ref 6.0–8.3)

## 2018-07-13 LAB — LIPID PANEL
Cholesterol: 190 mg/dL (ref 0–200)
HDL: 53.8 mg/dL (ref >39.00)
LDL Cholesterol: 116 mg/dL — ABNORMAL HIGH (ref 0–99)
NonHDL: 136.05
Total CHOL/HDL Ratio: 4
Triglycerides: 102 mg/dL (ref 0.0–149.0)
VLDL: 20.4 mg/dL (ref 0.0–40.0)

## 2018-07-13 LAB — TSH: TSH: 2.84 u[IU]/mL (ref 0.35–4.50)

## 2018-07-13 NOTE — Progress Notes (Signed)
Patient ID: Holly Oliver, female   DOB: 1980-10-21, 38 y.o.   MRN: [id number]   Subjective:    Patient ID: Holly Oliver, female    DOB: [birth year], 38 y.o.   MRN: [id number]  HPI  Patient here for her physical exam.  She reports she is doing relatively well.  Has adjusted her diet. Lost weight.  Feels better.  Is exercising.  No chest pain.  No sob.  No acid reflux.  No abdominal pain.  Bowels moving.  Has fever blister.  Using abreva.  Getting better.  Insurance not Acupuncturist.  Discussed oral antiviral. She declines. Will continue to use the abreva.  Notify me if persistent.  Some decreased libido.  Relationship going well. No pain with intercourse.  Will check routine labs.  Has red mole - right thigh.  Would like removed. Request referral to dermatology.    Past Medical History:  Diagnosis Date  . Family history of adverse reaction to anesthesia    Maternal Grandmother -PONV  . Heart murmur    followed by PCP  . History of chicken pox    Past Surgical History:  Procedure Laterality Date  . CESAREAN SECTION  11/02/09  . TONSILLECTOMY N/A 01/29/2017   Procedure: TONSILLECTOMY;  Surgeon: Beverly Gust, MD;  Location: [facility];  Service: ENT;  Laterality: N/A;   Family History  Problem Relation Age of Onset  . Hypertension Mother   . Hyperlipidemia Mother   . Hyperlipidemia Father   . Lung cancer Maternal Grandfather   . Prostate cancer Maternal Grandfather   . Breast cancer Maternal Aunt        great aunt  . Diabetes Maternal Aunt    Social History   Socioeconomic History  . Marital status: Married    Spouse name: Not on file  . Number of children: 1  . Years of education: Not on file  . Highest education level: Not on file  Occupational History  . Occupation: Surveyor, quantity: [employer]  . Financial resource strain: Not on file  . Food insecurity:    Worry: Not on file    Inability: Not on file  . Transportation  needs:    Medical: Not on file    Non-medical: Not on file  Tobacco Use  . Smoking status: Never Smoker  . Smokeless tobacco: Never Used  Substance and Sexual Activity  . Alcohol use: Yes    Alcohol/week: 0.0 standard drinks    Comment: Holidays  . Drug use: No  . Sexual activity: Not on file  Lifestyle  . Physical activity:    Days per week: Not on file    Minutes per session: Not on file  . Stress: Not on file  Relationships  . Social connections:    Talks on phone: Not on file    Gets together: Not on file    Attends religious service: Not on file    Active member of club or organization: Not on file    Attends meetings of clubs or organizations: Not on file    Relationship status: Not on file  Other Topics Concern  . Not on file  Social History Narrative  . Not on file    Outpatient Encounter Medications as of 07/13/2018  Medication Sig  . penciclovir (DENAVIR) 1 % cream Apply 1 application topically 4 (four) times daily as needed.   No facility-administered encounter medications on file as of 07/13/2018.  Review of Systems  Constitutional: Negative for appetite change and unexpected weight change.  HENT: Negative for congestion and sinus pressure.   Eyes: Negative for pain and visual disturbance.  Respiratory: Negative for cough, chest tightness and shortness of breath.   Cardiovascular: Negative for chest pain, palpitations and leg swelling.  Gastrointestinal: Negative for abdominal pain, diarrhea, nausea and vomiting.  Genitourinary: Negative for difficulty urinating and dysuria.  Musculoskeletal: Negative for joint swelling and myalgias.  Skin: Negative for color change and rash.  Neurological: Negative for dizziness, light-headedness and headaches.  Hematological: Negative for adenopathy. Does not bruise/bleed easily.  Psychiatric/Behavioral: Negative for agitation and dysphoric mood.       Objective:    Physical Exam Constitutional:      General: She  is not in acute distress.    Appearance: Normal appearance. She is well-developed.  HENT:     Right Ear: External ear normal. There is no impacted cerumen.     Left Ear: External ear normal. There is no impacted cerumen.  Eyes:     General: No scleral icterus.       Right eye: No discharge.        Left eye: No discharge.     Conjunctiva/sclera: Conjunctivae normal.  Neck:     Musculoskeletal: Neck supple. No muscular tenderness.     Thyroid: No thyromegaly.  Cardiovascular:     Rate and Rhythm: Normal rate and regular rhythm.  Pulmonary:     Effort: No tachypnea, accessory muscle usage or respiratory distress.     Breath sounds: Normal breath sounds. No decreased breath sounds or wheezing.  Chest:     Breasts:        Right: No inverted nipple, mass, nipple discharge or tenderness (no axillary adenopathy).        Left: No inverted nipple, mass, nipple discharge or tenderness (no axilarry adenopathy).  Abdominal:     General: Bowel sounds are normal.     Palpations: Abdomen is soft.     Tenderness: There is no abdominal tenderness.  Genitourinary:    Comments: Normal external genitalia.  Vaginal vault without lesions.  Cervix identified.  Pap smear performed.  Could not appreciate any adnexal masses or tenderness.   Musculoskeletal:        General: No swelling or tenderness.  Lymphadenopathy:     Cervical: No cervical adenopathy.  Skin:    Findings: No erythema or rash.     Comments: Red mole - right thigh.   Neurological:     Mental Status: She is alert and oriented to person, place, and time.  Psychiatric:        Mood and Affect: Mood normal.        Behavior: Behavior normal.     BP 108/70   Pulse 89   Temp 98.4 F (36.9 C) (Oral)   Resp 16   Wt 124 lb 12.8 oz (56.6 kg)   SpO2 99%   BMI 24.37 kg/m  Wt Readings from Last 3 Encounters:  07/13/18 124 lb 12.8 oz (56.6 kg)  07/08/17 150 lb 6.4 oz (68.2 kg)  01/29/17 152 lb (68.9 kg)     Lab Results  Component  Value Date   WBC 5.7 07/13/2018   HGB 14.2 07/13/2018   HCT 43.1 07/13/2018   PLT 212.0 07/13/2018   GLUCOSE 84 07/13/2018   CHOL 190 07/13/2018   TRIG 102.0 07/13/2018   HDL 53.80 07/13/2018   LDLCALC 116 (H) 07/13/2018   ALT 10 07/13/2018  AST 13 07/13/2018   NA 140 07/13/2018   K 4.5 07/13/2018   CL 105 07/13/2018   CREATININE 1.14 07/13/2018   BUN 12 07/13/2018   CO2 27 07/13/2018   TSH 2.84 07/13/2018       Assessment & Plan:   Problem List Items Addressed This Visit    Decreased libido    Discussed with her today.  Discussed obtain routine labs.        Relevant Orders   Comprehensive metabolic panel (Completed)   TSH (Completed)   CBC with Differential/Platelet (Completed)   Health care maintenance    Physical today 07/13/18.  PAP 07/13/18.  Discussed baseline mammogram.  Hold for now.       Skin lesion    Appears to have red mole - right thigh.  Pt states she hits it and will cause some intermittent bleeding,etc.  Request referral to dermatology.        Relevant Orders   Ambulatory referral to Dermatology    Other Visit Diagnoses    Routine general medical examination at a health care facility    -  Primary   Cervical cancer screening       Relevant Orders   Cytology - PAP( [facility]) (Completed)   Screening cholesterol level       Check cholesterol panel.    Relevant Orders   Lipid panel (Completed)       Einar Pheasant, MD

## 2018-07-15 LAB — CYTOLOGY - PAP
Diagnosis: NEGATIVE
HPV: NOT DETECTED

## 2018-07-17 ENCOUNTER — Encounter: Payer: Self-pay | Admitting: Internal Medicine

## 2018-07-17 DIAGNOSIS — L989 Disorder of the skin and subcutaneous tissue, unspecified: Secondary | ICD-10-CM | POA: Insufficient documentation

## 2018-07-17 NOTE — Assessment & Plan Note (Signed)
Discussed with her today.  Discussed obtain routine labs.

## 2018-07-17 NOTE — Assessment & Plan Note (Signed)
Physical today 07/13/18.  PAP 07/13/18.  Discussed baseline mammogram.  Hold for now.

## 2018-07-17 NOTE — Assessment & Plan Note (Signed)
Appears to have red mole - right thigh.  Pt states she hits it and will cause some intermittent bleeding,etc.  Request referral to dermatology.

## 2018-07-18 ENCOUNTER — Other Ambulatory Visit: Payer: Self-pay | Admitting: Internal Medicine

## 2018-07-18 DIAGNOSIS — R944 Abnormal results of kidney function studies: Secondary | ICD-10-CM

## 2018-07-18 NOTE — Progress Notes (Signed)
Order placed for f/u labs.  

## 2018-07-18 NOTE — Progress Notes (Signed)
Order placed for renal ultrasound.  

## 2018-07-22 ENCOUNTER — Ambulatory Visit (HOSPITAL_COMMUNITY)
Admission: RE | Admit: 2018-07-22 | Discharge: 2018-07-22 | Disposition: A | Discharge status: Home / Self Care | Payer: BC Managed Care – PPO | Source: Ambulatory Visit | Attending: Internal Medicine | Admitting: Internal Medicine

## 2018-07-22 ENCOUNTER — Other Ambulatory Visit: Payer: Self-pay

## 2018-07-22 DIAGNOSIS — R944 Abnormal results of kidney function studies: Secondary | ICD-10-CM | POA: Insufficient documentation

## 2018-07-25 ENCOUNTER — Encounter: Payer: Self-pay | Admitting: Internal Medicine

## 2018-08-15 ENCOUNTER — Other Ambulatory Visit: Payer: BC Managed Care – PPO

## 2018-08-24 ENCOUNTER — Other Ambulatory Visit (INDEPENDENT_AMBULATORY_CARE_PROVIDER_SITE_OTHER): Payer: BC Managed Care – PPO

## 2018-08-24 ENCOUNTER — Other Ambulatory Visit: Payer: Self-pay

## 2018-08-24 DIAGNOSIS — R944 Abnormal results of kidney function studies: Secondary | ICD-10-CM

## 2018-08-24 LAB — BASIC METABOLIC PANEL
BUN: 13 mg/dL (ref 6–23)
CO2: 26 mEq/L (ref 19–32)
Calcium: 8.6 mg/dL (ref 8.4–10.5)
Chloride: 107 mEq/L (ref 96–112)
Creatinine, Ser: 1.1 mg/dL (ref 0.40–1.20)
GFR: 56.02 mL/min — ABNORMAL LOW (ref >60.00)
Glucose, Bld: 84 mg/dL (ref 70–99)
Potassium: 3.9 mEq/L (ref 3.5–5.1)
Sodium: 140 mEq/L (ref 135–145)

## 2018-08-24 NOTE — Addendum Note (Signed)
Addended by: Leeanne Rio on: 08/24/2018 03:13 PM   Modules accepted: Orders

## 2018-08-24 NOTE — Addendum Note (Signed)
Addended by: Leeanne Rio on: 08/24/2018 11:06 AM   Modules accepted: Orders

## 2018-08-25 ENCOUNTER — Other Ambulatory Visit: Payer: Self-pay | Admitting: Internal Medicine

## 2018-08-25 DIAGNOSIS — R944 Abnormal results of kidney function studies: Secondary | ICD-10-CM

## 2018-08-25 LAB — URINALYSIS, ROUTINE W REFLEX MICROSCOPIC
Bilirubin Urine: NEGATIVE
Ketones, ur: NEGATIVE
Leukocytes,Ua: NEGATIVE
Nitrite: NEGATIVE
Specific Gravity, Urine: 1.015 (ref 1.000–1.030)
Total Protein, Urine: NEGATIVE
Urine Glucose: NEGATIVE
Urobilinogen, UA: 0.2 (ref 0.0–1.0)
pH: 6 (ref 5.0–8.0)

## 2018-08-25 NOTE — Progress Notes (Signed)
Order placed for f/u labs.  

## 2018-12-27 ENCOUNTER — Other Ambulatory Visit: Payer: BC Managed Care – PPO

## 2019-07-14 ENCOUNTER — Ambulatory Visit (INDEPENDENT_AMBULATORY_CARE_PROVIDER_SITE_OTHER): Payer: BC Managed Care – PPO | Admitting: Internal Medicine

## 2019-07-14 ENCOUNTER — Other Ambulatory Visit: Payer: Self-pay

## 2019-07-14 VITALS — BP 104/64 | HR 79 | Temp 98.0°F | Resp 16 | Ht 60.0 in | Wt 138.4 lb

## 2019-07-14 DIAGNOSIS — Z13220 Encounter for screening for lipoid disorders: Secondary | ICD-10-CM | POA: Diagnosis not present

## 2019-07-14 DIAGNOSIS — G479 Sleep disorder, unspecified: Secondary | ICD-10-CM | POA: Insufficient documentation

## 2019-07-14 DIAGNOSIS — Z0000 Encounter for general adult medical examination without abnormal findings: Secondary | ICD-10-CM

## 2019-07-14 LAB — LIPID PANEL
Cholesterol: 192 mg/dL (ref 0–200)
HDL: 45.6 mg/dL (ref >39.00)
LDL Cholesterol: 135 mg/dL — ABNORMAL HIGH (ref 0–99)
NonHDL: 146.44
Total CHOL/HDL Ratio: 4
Triglycerides: 57 mg/dL (ref 0.0–149.0)
VLDL: 11.4 mg/dL (ref 0.0–40.0)

## 2019-07-14 LAB — CBC WITH DIFFERENTIAL/PLATELET
Basophils Absolute: 0 10*3/uL (ref 0.0–0.1)
Basophils Relative: 0.6 % (ref 0.0–3.0)
Eosinophils Absolute: 0.1 10*3/uL (ref 0.0–0.7)
Eosinophils Relative: 1.5 % (ref 0.0–5.0)
HCT: 39.2 % (ref 36.0–46.0)
Hemoglobin: 12.8 g/dL (ref 12.0–15.0)
Lymphocytes Relative: 36 % (ref 12.0–46.0)
Lymphs Abs: 1.9 10*3/uL (ref 0.7–4.0)
MCHC: 32.6 g/dL (ref 30.0–36.0)
MCV: 83.5 fl (ref 78.0–100.0)
Monocytes Absolute: 0.4 10*3/uL (ref 0.1–1.0)
Monocytes Relative: 7.1 % (ref 3.0–12.0)
Neutro Abs: 2.9 10*3/uL (ref 1.4–7.7)
Neutrophils Relative %: 54.8 % (ref 43.0–77.0)
Platelets: 200 10*3/uL (ref 150.0–400.0)
RBC: 4.69 Mil/uL (ref 3.87–5.11)
RDW: 16.8 % — ABNORMAL HIGH (ref 11.5–15.5)
WBC: 5.3 10*3/uL (ref 4.0–10.5)

## 2019-07-14 LAB — COMPREHENSIVE METABOLIC PANEL
ALT: 10 U/L (ref 0–35)
AST: 12 U/L (ref 0–37)
Albumin: 4.2 g/dL (ref 3.5–5.2)
Alkaline Phosphatase: 53 U/L (ref 39–117)
BUN: 18 mg/dL (ref 6–23)
CO2: 24 mEq/L (ref 19–32)
Calcium: 8.9 mg/dL (ref 8.4–10.5)
Chloride: 108 mEq/L (ref 96–112)
Creatinine, Ser: 1.13 mg/dL (ref 0.40–1.20)
GFR: 54.04 mL/min — ABNORMAL LOW (ref >60.00)
Glucose, Bld: 82 mg/dL (ref 70–99)
Potassium: 4.3 mEq/L (ref 3.5–5.1)
Sodium: 138 mEq/L (ref 135–145)
Total Bilirubin: 0.4 mg/dL (ref 0.2–1.2)
Total Protein: 6.5 g/dL (ref 6.0–8.3)

## 2019-07-14 LAB — TSH: TSH: 2.91 u[IU]/mL (ref 0.35–4.50)

## 2019-07-14 MED ORDER — TRAZODONE HCL 50 MG PO TABS: 25.0000 mg | ORAL_TABLET | Freq: Every evening | ORAL | 1 refills | Status: DC | PRN

## 2019-07-14 NOTE — Assessment & Plan Note (Addendum)
Physical today 07/14/19.  PAP 07/13/18 - negative with negative HPV.  Check routine labs.  Discussed baseline mammogram. She wants to hold.

## 2019-07-14 NOTE — Progress Notes (Signed)
Patient ID: Holly Oliver, female   DOB: 02/09/1981, 39 y.o.   MRN: [id number]   Subjective:    Patient ID: Holly Oliver, female    DOB: 1980/02/11, 39 y.o.   MRN: [id number]  HPI This visit occurred during the SARS-CoV-2 public health emergency.  Safety protocols were in place, including screening questions prior to the visit, additional usage of staff PPE, and extensive cleaning of exam room while observing appropriate contact time as indicated for disinfecting solutions.  Patient here for her physical exam.   Her main complaint is that of difficulty staying asleep.  She reports no problems falling asleep.  Wakes up throughout the night.  No nocturia.  Denies snoring or witnessed apneic episodes.  No increased stress.  Not sleeping has been an issue now for the last couple of months.  Has tried melatonin.  Did not help.  Feels needs something to help her sleep.  Tries to stay active.  Has not been exercising as much recently.  Plans to get back into an exercise routine.  No chest pain or sob reported.  No acid reflux or abdominal pain reported.  Bowels stable.  LMP 06/25/19.  Regular periods - q 27-28 days.  Husband has had a vasectomy.     Past Medical History:  Diagnosis Date  . Family history of adverse reaction to anesthesia    Maternal Grandmother -PONV  . Heart murmur    followed by PCP  . History of chicken pox    Past Surgical History:  Procedure Laterality Date  . CESAREAN SECTION  11/02/09  . TONSILLECTOMY N/A 01/29/2017   Procedure: TONSILLECTOMY;  Surgeon: Beverly Gust, MD;  Location: [place];  Service: ENT;  Laterality: N/A;   Family History  Problem Relation Age of Onset  . Hypertension Mother   . Hyperlipidemia Mother   . Hyperlipidemia Father   . Lung cancer Maternal Grandfather   . Prostate cancer Maternal Grandfather   . Breast cancer Maternal Aunt        great aunt  . Diabetes Maternal Aunt    Social History   Socioeconomic History  .  Marital status: Married    Spouse name: Not on file  . Number of children: 1  . Years of education: Not on file  . Highest education level: Not on file  Occupational History  . Occupation: Surveyor, quantity: [employer]  Tobacco Use  . Smoking status: Never Smoker  . Smokeless tobacco: Never Used  Substance and Sexual Activity  . Alcohol use: Yes    Alcohol/week: 0.0 standard drinks    Comment: Holidays  . Drug use: No  . Sexual activity: Not on file  Other Topics Concern  . Not on file  Social History Narrative  . Not on file   Social Determinants of Health   Financial Resource Strain:   . Difficulty of Paying Living Expenses:   Food Insecurity:   . Worried About Charity fundraiser in the Last Year:   . Arboriculturist in the Last Year:   Transportation Needs:   . Film/video editor (Medical):   Marland Kitchen Lack of Transportation (Non-Medical):   Physical Activity:   . Days of Exercise per Week:   . Minutes of Exercise per Session:   Stress:   . Feeling of Stress :   Social Connections:   . Frequency of Communication with Friends and Family:   . Frequency of Social Gatherings with Friends and  Family:   . Attends Religious Services:   . Active Member of Clubs or Organizations:   . Attends Archivist Meetings:   Marland Kitchen Marital Status:     Outpatient Encounter Medications as of 07/14/2019  Medication Sig  . traZODone (DESYREL) 50 MG tablet Take 0.5-1 tablets (25-50 mg total) by mouth at bedtime as needed for sleep.  . [DISCONTINUED] penciclovir (DENAVIR) 1 % cream Apply 1 application topically 4 (four) times daily as needed.   No facility-administered encounter medications on file as of 07/14/2019.    Review of Systems  Constitutional: Negative for appetite change and unexpected weight change.  HENT: Negative for congestion and sinus pressure.   Eyes: Negative for pain and visual disturbance.  Respiratory: Negative for cough, chest tightness and shortness of  breath.   Cardiovascular: Negative for chest pain, palpitations and leg swelling.  Gastrointestinal: Negative for abdominal pain, diarrhea, nausea and vomiting.  Genitourinary: Negative for difficulty urinating and dysuria.  Musculoskeletal: Negative for joint swelling and myalgias.  Skin: Negative for color change and rash.  Neurological: Negative for dizziness, light-headedness and headaches.  Hematological: Negative for adenopathy. Does not bruise/bleed easily.  Psychiatric/Behavioral: Negative for agitation and dysphoric mood.       Objective:    Physical Exam Vitals reviewed.  Constitutional:      General: She is not in acute distress.    Appearance: Normal appearance. She is well-developed.  HENT:     Head: Normocephalic and atraumatic.     Right Ear: External ear normal.     Left Ear: External ear normal.  Eyes:     General: No scleral icterus.       Right eye: No discharge.        Left eye: No discharge.     Conjunctiva/sclera: Conjunctivae normal.  Neck:     Thyroid: No thyromegaly.  Cardiovascular:     Rate and Rhythm: Normal rate and regular rhythm.  Pulmonary:     Effort: No tachypnea, accessory muscle usage or respiratory distress.     Breath sounds: Normal breath sounds. No decreased breath sounds or wheezing.  Chest:     Breasts:        Right: No inverted nipple, mass, nipple discharge or tenderness (no axillary adenopathy).        Left: No inverted nipple, mass, nipple discharge or tenderness (no axilarry adenopathy).  Abdominal:     General: Bowel sounds are normal.     Palpations: Abdomen is soft.     Tenderness: There is no abdominal tenderness.  Musculoskeletal:        General: No swelling or tenderness.     Cervical back: Neck supple. No tenderness.  Lymphadenopathy:     Cervical: No cervical adenopathy.  Skin:    Findings: No erythema or rash.  Neurological:     Mental Status: She is alert and oriented to person, place, and time.  Psychiatric:         Mood and Affect: Mood normal.        Behavior: Behavior normal.     BP 104/64   Pulse 79   Temp 98 F (36.7 C)   Resp 16   Ht 5' (1.524 m)   Wt 138 lb 6.4 oz (62.8 kg)   SpO2 98%   BMI 27.03 kg/m  Wt Readings from Last 3 Encounters:  07/14/19 138 lb 6.4 oz (62.8 kg)  07/13/18 124 lb 12.8 oz (56.6 kg)  07/08/17 150 lb 6.4 oz (68.2 kg)  Lab Results  Component Value Date   WBC 5.3 07/14/2019   HGB 12.8 07/14/2019   HCT 39.2 07/14/2019   PLT 200.0 07/14/2019   GLUCOSE 82 07/14/2019   CHOL 192 07/14/2019   TRIG 57.0 07/14/2019   HDL 45.60 07/14/2019   LDLCALC 135 (H) 07/14/2019   ALT 10 07/14/2019   AST 12 07/14/2019   NA 138 07/14/2019   K 4.3 07/14/2019   CL 108 07/14/2019   CREATININE 1.13 07/14/2019   BUN 18 07/14/2019   CO2 24 07/14/2019   TSH 2.91 07/14/2019    US Renal  Result Date: 07/22/2018 CLINICAL DATA:  Decreased GFR EXAM: RENAL / URINARY TRACT ULTRASOUND COMPLETE COMPARISON:  None. FINDINGS: Right Kidney: Renal measurements: 9.6 x 4.0 x 4.7 cm = volume: 94.8 mL. The cortex measures 7 mm with mild thinning. No focal mass. No hydronephrosis. Left Kidney: Renal measurements: 9.8 x 4.2 x 4.9 cm = volume: 103.3 mL. Mild cortical thinning measuring 7 mm. Bladder: Appears normal for degree of bladder distention. IMPRESSION: The cortex is somewhat thinned bilaterally measuring 7 mm in thickness. No other abnormalities. Electronically Signed   By: Dorise Bullion III M.D   On: 07/22/2018 17:25       Assessment & Plan:   Problem List Items Addressed This Visit    Difficulty sleeping    Discussed with her today.  Has tried melatonin.  Plans to start exercising regularly.  Trial of trazodone.  Follow.        Relevant Orders   CBC with Differential/Platelet (Completed)   Comprehensive metabolic panel (Completed)   TSH (Completed)   Health care maintenance    Physical today 07/14/19.  PAP 07/13/18 - negative with negative HPV.  Check routine labs.   Discussed baseline mammogram. She wants to hold.         Other Visit Diagnoses    Screening cholesterol level    -  Primary   Relevant Orders   Lipid panel (Completed)       Einar Pheasant, MD

## 2019-07-15 ENCOUNTER — Encounter: Payer: Self-pay | Admitting: Internal Medicine

## 2019-07-15 NOTE — Assessment & Plan Note (Signed)
Discussed with her today.  Has tried melatonin.  Plans to start exercising regularly.  Trial of trazodone.  Follow.

## 2019-07-18 ENCOUNTER — Encounter: Payer: Self-pay | Admitting: Internal Medicine

## 2019-10-13 ENCOUNTER — Other Ambulatory Visit: Payer: Self-pay

## 2019-10-13 ENCOUNTER — Telehealth (INDEPENDENT_AMBULATORY_CARE_PROVIDER_SITE_OTHER): Payer: BC Managed Care – PPO | Admitting: Internal Medicine

## 2019-10-13 ENCOUNTER — Encounter: Payer: Self-pay | Admitting: Internal Medicine

## 2019-10-13 DIAGNOSIS — R0981 Nasal congestion: Secondary | ICD-10-CM | POA: Diagnosis not present

## 2019-10-13 DIAGNOSIS — G479 Sleep disorder, unspecified: Secondary | ICD-10-CM | POA: Diagnosis not present

## 2019-10-13 NOTE — Progress Notes (Signed)
Patient ID: Holly Oliver, female   DOB: 22-Oct-1980, 39 y.o.   MRN: [id number]   Virtual Visit via video Note  This visit type was conducted due to national recommendations for restrictions regarding the COVID-19 pandemic (e.g. social distancing).  This format is felt to be most appropriate for this patient at this time.  All issues noted in this document were discussed and addressed.  No physical exam was performed (except for noted visual exam findings with Video Visits).   I connected with Domingo Madeira by a video enabled telemedicine application and verified that I am speaking with the correct person using two identifiers. Location patient: work Location provider: work  Persons participating in the virtual visit: patient, provider  The limitations, risks, security and privacy concerns of performing an evaluation and management service by video and the availability of in person appointments have been discussed.  It has also been discussed with the patient that there may be a patient responsible charge related to this service. The patient expressed understanding and agreed to proceed.   Reason for visit:  Follow up   HPI: Last visit had reported difficulty sleeping.  Was started on trazodone.  Took trazadone for 3 weeks.  Off now.  Has been off for one month. Doing well without the medication.  Handling stress.  Does report that she noticed approximately 5 days ago, sore throat.  Better now.  No fever.  Some drainage.  Energy ok.  Minimal congestion.  No chest pain or sob. Eating.  No vomiting.  Discussed covid testing and quarantine.    ROS: See pertinent positives and negatives per HPI.  Past Medical History:  Diagnosis Date  . Family history of adverse reaction to anesthesia    Maternal Grandmother -PONV  . Heart murmur    followed by PCP  . History of chicken pox     Past Surgical History:  Procedure Laterality Date  . CESAREAN SECTION  11/02/09  . TONSILLECTOMY N/A  01/29/2017   Procedure: TONSILLECTOMY;  Surgeon: Beverly Gust, MD;  Location: [place];  Service: ENT;  Laterality: N/A;    Family History  Problem Relation Age of Onset  . Hypertension Mother   . Hyperlipidemia Mother   . Hyperlipidemia Father   . Lung cancer Maternal Grandfather   . Prostate cancer Maternal Grandfather   . Breast cancer Maternal Aunt        great aunt  . Diabetes Maternal Aunt     SOCIAL HX: reviewed.    Current Outpatient Medications:  .  traZODone (DESYREL) 50 MG tablet, Take 0.5-1 tablets (25-50 mg total) by mouth at bedtime as needed for sleep., Disp: 30 tablet, Rfl: 1  EXAM:  GENERAL: alert, oriented, appears well and in no acute distress  HEENT: atraumatic, conjunttiva clear, no obvious abnormalities on inspection of external nose and ears  NECK: normal movements of the head and neck  LUNGS: on inspection no signs of respiratory distress, breathing rate appears normal, no obvious gross SOB, gasping or wheezing  CV: no obvious cyanosis  PSYCH/NEURO: pleasant and cooperative, no obvious depression or anxiety, speech and thought processing grossly intact  ASSESSMENT AND PLAN:  Discussed the following assessment and plan:  Difficulty sleeping Took trazodone for a brief period.  Sleeping better now.  Not needing trazodone.  Doing better. Follow.    Nasal congestion Previous sore throat.  Some nasal congestion/drainage.  No fever.  Energy ok.  Discussed possible etiologies.  Can treat with saline nasal spray,  nasacort, robitussin if needed.  Discussed the possibility of covid.  Discussed testing and quarantine.  Follow closely.      I discussed the assessment and treatment plan with the patient. The patient was provided an opportunity to ask questions and all were answered. The patient agreed with the plan and demonstrated an understanding of the instructions.   The patient was advised to call back or seek an in-person evaluation if  the symptoms worsen or if the condition fails to improve as anticipated.   Einar Pheasant, MD

## 2019-10-22 ENCOUNTER — Encounter: Payer: Self-pay | Admitting: Internal Medicine

## 2019-10-22 DIAGNOSIS — R0981 Nasal congestion: Secondary | ICD-10-CM | POA: Insufficient documentation

## 2019-10-22 NOTE — Assessment & Plan Note (Signed)
Previous sore throat.  Some nasal congestion/drainage.  No fever.  Energy ok.  Discussed possible etiologies.  Can treat with saline nasal spray, nasacort, robitussin if needed.  Discussed the possibility of covid.  Discussed testing and quarantine.  Follow closely.

## 2019-10-22 NOTE — Assessment & Plan Note (Signed)
Took trazodone for a brief period.  Sleeping better now.  Not needing trazodone.  Doing better. Follow.

## 2019-12-20 ENCOUNTER — Encounter: Payer: Self-pay | Admitting: Internal Medicine

## 2019-12-20 MED ORDER — ACYCLOVIR 400 MG PO TABS: 400.0000 mg | ORAL_TABLET | Freq: Three times a day (TID) | ORAL | 1 refills | Status: DC

## 2019-12-20 NOTE — Telephone Encounter (Signed)
rx sent in for acyclovir #30 with one refill.

## 2020-05-07 ENCOUNTER — Other Ambulatory Visit: Payer: Self-pay | Admitting: Internal Medicine

## 2020-06-27 ENCOUNTER — Encounter: Payer: Self-pay | Admitting: Internal Medicine

## 2020-07-15 ENCOUNTER — Encounter: Payer: BC Managed Care – PPO | Admitting: Internal Medicine

## 2020-10-10 ENCOUNTER — Other Ambulatory Visit (HOSPITAL_COMMUNITY)
Admission: RE | Admit: 2020-10-10 | Discharge: 2020-10-10 | Disposition: A | Discharge status: Home / Self Care | Payer: BC Managed Care – PPO | Source: Ambulatory Visit | Attending: Internal Medicine | Admitting: Internal Medicine

## 2020-10-10 ENCOUNTER — Encounter: Payer: Self-pay | Admitting: Internal Medicine

## 2020-10-10 ENCOUNTER — Ambulatory Visit (INDEPENDENT_AMBULATORY_CARE_PROVIDER_SITE_OTHER): Payer: BC Managed Care – PPO | Admitting: Internal Medicine

## 2020-10-10 ENCOUNTER — Other Ambulatory Visit: Payer: Self-pay

## 2020-10-10 VITALS — BP 120/74 | HR 76 | Temp 97.8°F | Resp 16 | Ht 60.0 in | Wt 153.4 lb

## 2020-10-10 DIAGNOSIS — G479 Sleep disorder, unspecified: Secondary | ICD-10-CM

## 2020-10-10 DIAGNOSIS — L989 Disorder of the skin and subcutaneous tissue, unspecified: Secondary | ICD-10-CM

## 2020-10-10 DIAGNOSIS — Z13220 Encounter for screening for lipoid disorders: Secondary | ICD-10-CM | POA: Diagnosis not present

## 2020-10-10 DIAGNOSIS — Z0000 Encounter for general adult medical examination without abnormal findings: Secondary | ICD-10-CM

## 2020-10-10 DIAGNOSIS — Z124 Encounter for screening for malignant neoplasm of cervix: Secondary | ICD-10-CM | POA: Insufficient documentation

## 2020-10-10 DIAGNOSIS — K6289 Other specified diseases of anus and rectum: Secondary | ICD-10-CM

## 2020-10-10 DIAGNOSIS — R635 Abnormal weight gain: Secondary | ICD-10-CM

## 2020-10-10 LAB — LIPID PANEL
Cholesterol: 188 mg/dL (ref 0–200)
HDL: 49.1 mg/dL (ref >39.00)
LDL Cholesterol: 125 mg/dL — ABNORMAL HIGH (ref 0–99)
NonHDL: 138.62
Total CHOL/HDL Ratio: 4
Triglycerides: 66 mg/dL (ref 0.0–149.0)
VLDL: 13.2 mg/dL (ref 0.0–40.0)

## 2020-10-10 LAB — CBC WITH DIFFERENTIAL/PLATELET
Basophils Absolute: 0 10*3/uL (ref 0.0–0.1)
Basophils Relative: 0.7 % (ref 0.0–3.0)
Eosinophils Absolute: 0.2 10*3/uL (ref 0.0–0.7)
Eosinophils Relative: 3.3 % (ref 0.0–5.0)
HCT: 39.6 % (ref 36.0–46.0)
Hemoglobin: 12.6 g/dL (ref 12.0–15.0)
Lymphocytes Relative: 37 % (ref 12.0–46.0)
Lymphs Abs: 2 10*3/uL (ref 0.7–4.0)
MCHC: 32 g/dL (ref 30.0–36.0)
MCV: 86.4 fl (ref 78.0–100.0)
Monocytes Absolute: 0.4 10*3/uL (ref 0.1–1.0)
Monocytes Relative: 8.2 % (ref 3.0–12.0)
Neutro Abs: 2.7 10*3/uL (ref 1.4–7.7)
Neutrophils Relative %: 50.8 % (ref 43.0–77.0)
Platelets: 216 10*3/uL (ref 150.0–400.0)
RBC: 4.58 Mil/uL (ref 3.87–5.11)
RDW: 15.8 % — ABNORMAL HIGH (ref 11.5–15.5)
WBC: 5.4 10*3/uL (ref 4.0–10.5)

## 2020-10-10 LAB — COMPREHENSIVE METABOLIC PANEL
ALT: 11 U/L (ref 0–35)
AST: 16 U/L (ref 0–37)
Albumin: 3.8 g/dL (ref 3.5–5.2)
Alkaline Phosphatase: 51 U/L (ref 39–117)
BUN: 8 mg/dL (ref 6–23)
CO2: 26 mEq/L (ref 19–32)
Calcium: 8.5 mg/dL (ref 8.4–10.5)
Chloride: 107 mEq/L (ref 96–112)
Creatinine, Ser: 1.09 mg/dL (ref 0.40–1.20)
GFR: 64.35 mL/min (ref >60.00)
Glucose, Bld: 82 mg/dL (ref 70–99)
Potassium: 4.3 mEq/L (ref 3.5–5.1)
Sodium: 139 mEq/L (ref 135–145)
Total Bilirubin: 0.4 mg/dL (ref 0.2–1.2)
Total Protein: 6.3 g/dL (ref 6.0–8.3)

## 2020-10-10 LAB — TSH: TSH: 4.36 u[IU]/mL (ref 0.35–5.50)

## 2020-10-10 MED ORDER — ACYCLOVIR 400 MG PO TABS: 400.0000 mg | ORAL_TABLET | Freq: Three times a day (TID) | ORAL | 1 refills | Status: DC

## 2020-10-10 MED ORDER — HYDROCORTISONE ACETATE 25 MG RE SUPP: 25.0000 mg | Freq: Two times a day (BID) | RECTAL | 0 refills | Status: DC

## 2020-10-10 NOTE — Progress Notes (Signed)
Patient ID: Holly Oliver, female   DOB: [birth year], 40 y.o.   MRN: [id number]   Subjective:    Patient ID: Holly Oliver, female    DOB: 17-Jan-1981, 40 y.o.   MRN: [id number]  This visit occurred during the SARS-CoV-2 public health emergency.  Safety protocols were in place, including screening questions prior to the visit, additional usage of staff PPE, and extensive cleaning of exam room while observing appropriate contact time as indicated for disinfecting solutions.   Patient here for  Chief Complaint  Patient presents with   Annual Exam   .   HPI Physical today.  She reports she is doing relatively well.  Trying to stay active.  No chest pain reported.  Breathing stable.  No increased cough or congestion.  No increased abdominal pain reported.  No bowel issues reported.  No sick contacts.  No fever.  No nausea or vomiting. Bowels moving. She is concerned regarding weight gain.  She has adjusted her diet.  Decreased sugar drinks.  Walking.  Also is concerned regarding recurring yeast.  Has anal skin tag.  Noticed some drainage (pus).  Better now, but still persistent.     Past Medical History:  Diagnosis Date   Family history of adverse reaction to anesthesia    Maternal Grandmother -PONV   Heart murmur    followed by PCP   History of chicken pox    Past Surgical History:  Procedure Laterality Date   CESAREAN SECTION  11/02/09   TONSILLECTOMY N/A 01/29/2017   Procedure: TONSILLECTOMY;  Surgeon: Beverly Gust, MD;  Location: [place];  Service: ENT;  Laterality: N/A;   Family History  Problem Relation Age of Onset   Hypertension Mother    Hyperlipidemia Mother    Hyperlipidemia Father    Lung cancer Maternal Grandfather    Prostate cancer Maternal Grandfather    Breast cancer Maternal Aunt        great aunt   Diabetes Maternal Aunt    Social History   Socioeconomic History   Marital status: Married    Spouse name: Not on file   Number of  children: 1   Years of education: Not on file   Highest education level: Not on file  Occupational History   Occupation: Surveyor, quantity: [employer]  Tobacco Use   Smoking status: Never   Smokeless tobacco: Never  Vaping Use   Vaping Use: Never used  Substance and Sexual Activity   Alcohol use: Yes    Alcohol/week: 0.0 standard drinks    Comment: Holidays   Drug use: No   Sexual activity: Not on file  Other Topics Concern   Not on file  Social History Narrative   Not on file   Social Determinants of Health   Financial Resource Strain: Not on file  Food Insecurity: Not on file  Transportation Needs: Not on file  Physical Activity: Not on file  Stress: Not on file  Social Connections: Not on file     Review of Systems  Constitutional:  Negative for appetite change and unexpected weight change.  HENT:  Negative for congestion, sinus pressure and sore throat.   Eyes:  Negative for pain and visual disturbance.  Respiratory:  Negative for cough, chest tightness and shortness of breath.   Cardiovascular:  Negative for chest pain, palpitations and leg swelling.  Gastrointestinal:  Negative for abdominal pain, diarrhea, nausea and vomiting.  Genitourinary:  Negative for difficulty urinating and dysuria.  Rectum - skin tag.  Persistent.   Musculoskeletal:  Negative for joint swelling and myalgias.  Skin:  Negative for color change and rash.  Neurological:  Negative for dizziness, light-headedness and headaches.  Hematological:  Negative for adenopathy. Does not bruise/bleed easily.  Psychiatric/Behavioral:  Negative for agitation and dysphoric mood.       Objective:     BP 120/74   Pulse 76   Temp 97.8 F (36.6 C)   Resp 16   Ht 5' (1.524 m)   Wt 153 lb 6.4 oz (69.6 kg)   SpO2 99%   BMI 29.96 kg/m  Wt Readings from Last 3 Encounters:  10/10/20 153 lb 6.4 oz (69.6 kg)  07/14/19 138 lb 6.4 oz (62.8 kg)  07/13/18 124 lb 12.8 oz (56.6 kg)    Physical  Exam Vitals reviewed.  Constitutional:      General: She is not in acute distress.    Appearance: Normal appearance. She is well-developed.  HENT:     Head: Normocephalic and atraumatic.     Right Ear: External ear normal.     Left Ear: External ear normal.  Eyes:     General: No scleral icterus.       Right eye: No discharge.        Left eye: No discharge.     Conjunctiva/sclera: Conjunctivae normal.  Neck:     Thyroid: No thyromegaly.  Cardiovascular:     Rate and Rhythm: Normal rate and regular rhythm.  Pulmonary:     Effort: No tachypnea, accessory muscle usage or respiratory distress.     Breath sounds: Normal breath sounds. No decreased breath sounds or wheezing.  Chest:  Breasts:    Right: No inverted nipple, mass, nipple discharge or tenderness (no axillary adenopathy).     Left: No inverted nipple, mass, nipple discharge or tenderness (no axilarry adenopathy).  Abdominal:     General: Bowel sounds are normal.     Palpations: Abdomen is soft.     Tenderness: There is no abdominal tenderness.  Genitourinary:    Comments: Normal external genitalia.  Vaginal vault without lesions.  Cervix identified.  Pap smear performed.  Could not appreciate any adnexal masses or tenderness.  Rectal exam - skin tag/lesion - question of fissure.  No blood.   Musculoskeletal:        General: No swelling or tenderness.     Cervical back: Neck supple.  Lymphadenopathy:     Cervical: No cervical adenopathy.  Skin:    Findings: No erythema or rash.  Neurological:     Mental Status: She is alert and oriented to person, place, and time.  Psychiatric:        Mood and Affect: Mood normal.        Behavior: Behavior normal.     Outpatient Encounter Medications as of 10/10/2020  Medication Sig   hydrocortisone (ANUSOL-HC) 25 MG suppository Place 1 suppository (25 mg total) rectally 2 (two) times daily.   acyclovir (ZOVIRAX) 400 MG tablet Take 1 tablet (400 mg total) by mouth 3 (three) times  daily. For 5 days.   traZODone (DESYREL) 50 MG tablet TAKE 0.5-1 TABLETS (25-50 MG TOTAL) BY MOUTH AT BEDTIME AS NEEDED FOR SLEEP.   [DISCONTINUED] acyclovir (ZOVIRAX) 400 MG tablet Take 1 tablet (400 mg total) by mouth 3 (three) times daily. For 5 days.   No facility-administered encounter medications on file as of 10/10/2020.     Lab Results  Component Value Date   WBC  5.4 10/10/2020   HGB 12.6 10/10/2020   HCT 39.6 10/10/2020   PLT 216.0 10/10/2020   GLUCOSE 82 10/10/2020   CHOL 188 10/10/2020   TRIG 66.0 10/10/2020   HDL 49.10 10/10/2020   LDLCALC 125 (H) 10/10/2020   ALT 11 10/10/2020   AST 16 10/10/2020   NA 139 10/10/2020   K 4.3 10/10/2020   CL 107 10/10/2020   CREATININE 1.09 10/10/2020   BUN 8 10/10/2020   CO2 26 10/10/2020   TSH 4.36 10/10/2020    US Renal  Result Date: 07/22/2018 CLINICAL DATA:  Decreased GFR EXAM: RENAL / URINARY TRACT ULTRASOUND COMPLETE COMPARISON:  None. FINDINGS: Right Kidney: Renal measurements: 9.6 x 4.0 x 4.7 cm = volume: 94.8 mL. The cortex measures 7 mm with mild thinning. No focal mass. No hydronephrosis. Left Kidney: Renal measurements: 9.8 x 4.2 x 4.9 cm = volume: 103.3 mL. Mild cortical thinning measuring 7 mm. Bladder: Appears normal for degree of bladder distention. IMPRESSION: The cortex is somewhat thinned bilaterally measuring 7 mm in thickness. No other abnormalities. Electronically Signed   By: Dorise Bullion III M.D   On: 07/22/2018 17:25       Assessment & Plan:   Problem List Items Addressed This Visit     Anal irritation    Persistent.  Previously drainage.  Question of fissure.  anusol HC suppositories.  Follow.  Call with update.        Difficulty sleeping    Not needing trazodone now.  Follow.        Health care maintenance    Physical today 10/10/20.  PAP 07/13/18 - negative with negative HPV.  Desired pap today - 10/10/20.  Discussed baseline mammogram.        Skin lesion    Persistent skin lesion.  Changing.   Will have dermatology evaluate.       Skin lesion of right arm    Given persistence, refer to dermatology.       Relevant Orders   Ambulatory referral to Dermatology   Weight gain    Increased weight as outlined.  Discussed diet and exercise.  Follow.  Check routine labs.        Relevant Orders   CBC with Differential/Platelet (Completed)   Comprehensive metabolic panel (Completed)   TSH (Completed)   Other Visit Diagnoses     Routine general medical examination at a health care facility    -  Primary   Screening cholesterol level       Relevant Orders   Lipid panel (Completed)   Cervical cancer screening       Relevant Orders   Cytology - PAP( [facility]) (Completed)        Einar Pheasant, MD

## 2020-10-10 NOTE — Assessment & Plan Note (Addendum)
Increased weight as outlined.  Discussed diet and exercise.  Follow.  Check routine labs.

## 2020-10-10 NOTE — Assessment & Plan Note (Signed)
Persistent skin lesion.  Changing.  Will have dermatology evaluate.

## 2020-10-10 NOTE — Assessment & Plan Note (Addendum)
Physical today 10/10/20.  PAP 07/13/18 - negative with negative HPV.  Desired pap today - 10/10/20.  Discussed baseline mammogram.

## 2020-10-11 LAB — CYTOLOGY - PAP
Comment: NEGATIVE
Diagnosis: NEGATIVE
High risk HPV: NEGATIVE

## 2020-10-13 ENCOUNTER — Encounter: Payer: Self-pay | Admitting: Internal Medicine

## 2020-10-14 ENCOUNTER — Encounter: Payer: Self-pay | Admitting: Internal Medicine

## 2020-10-14 DIAGNOSIS — K6289 Other specified diseases of anus and rectum: Secondary | ICD-10-CM | POA: Insufficient documentation

## 2020-10-14 NOTE — Assessment & Plan Note (Signed)
Persistent.  Previously drainage.  Question of fissure.  anusol HC suppositories.  Follow.  Call with update.

## 2020-10-14 NOTE — Assessment & Plan Note (Signed)
Given persistence, refer to dermatology.

## 2020-10-14 NOTE — Assessment & Plan Note (Signed)
Not needing trazodone now.  Follow.

## 2020-10-15 MED ORDER — ACYCLOVIR 5 % EX OINT: 1.0000 "application " | TOPICAL_OINTMENT | Freq: Four times a day (QID) | CUTANEOUS | 0 refills | Status: DC | PRN

## 2020-10-15 NOTE — Telephone Encounter (Signed)
Rx sent in for acyclovir ointment.

## 2021-04-16 ENCOUNTER — Telehealth: Payer: Self-pay

## 2021-04-16 NOTE — Telephone Encounter (Signed)
LVM for patient rescheduled for 05/14/21 at 8:30 am with Dr. Nicole Kindred. ?Lurlean Horns., RMA ?

## 2021-04-23 ENCOUNTER — Ambulatory Visit: Payer: BC Managed Care – PPO | Admitting: Dermatology

## 2021-05-14 ENCOUNTER — Ambulatory Visit: Payer: BC Managed Care – PPO | Admitting: Dermatology

## 2021-07-10 ENCOUNTER — Encounter: Payer: Self-pay | Admitting: Internal Medicine

## 2021-07-15 ENCOUNTER — Ambulatory Visit: Payer: BC Managed Care – PPO | Admitting: Internal Medicine

## 2021-07-15 ENCOUNTER — Encounter: Payer: Self-pay | Admitting: Internal Medicine

## 2021-07-15 VITALS — BP 124/80 | HR 103 | Temp 97.9°F | Resp 19 | Ht 60.0 in | Wt 161.2 lb

## 2021-07-15 DIAGNOSIS — R635 Abnormal weight gain: Secondary | ICD-10-CM | POA: Diagnosis not present

## 2021-07-15 DIAGNOSIS — Z13220 Encounter for screening for lipoid disorders: Secondary | ICD-10-CM

## 2021-07-15 DIAGNOSIS — B3731 Acute candidiasis of vulva and vagina: Secondary | ICD-10-CM | POA: Diagnosis not present

## 2021-07-15 LAB — COMPREHENSIVE METABOLIC PANEL
ALT: 10 U/L (ref 0–35)
AST: 13 U/L (ref 0–37)
Albumin: 4.1 g/dL (ref 3.5–5.2)
Alkaline Phosphatase: 68 U/L (ref 39–117)
BUN: 13 mg/dL (ref 6–23)
CO2: 24 mEq/L (ref 19–32)
Calcium: 9.1 mg/dL (ref 8.4–10.5)
Chloride: 105 mEq/L (ref 96–112)
Creatinine, Ser: 1.04 mg/dL (ref 0.40–1.20)
GFR: 67.72 mL/min (ref >60.00)
Glucose, Bld: 78 mg/dL (ref 70–99)
Potassium: 4 mEq/L (ref 3.5–5.1)
Sodium: 138 mEq/L (ref 135–145)
Total Bilirubin: 0.6 mg/dL (ref 0.2–1.2)
Total Protein: 6.7 g/dL (ref 6.0–8.3)

## 2021-07-15 LAB — CBC WITH DIFFERENTIAL/PLATELET
Basophils Absolute: 0.1 10*3/uL (ref 0.0–0.1)
Basophils Relative: 0.8 % (ref 0.0–3.0)
Eosinophils Absolute: 0.1 10*3/uL (ref 0.0–0.7)
Eosinophils Relative: 1.7 % (ref 0.0–5.0)
HCT: 44.1 % (ref 36.0–46.0)
Hemoglobin: 14.4 g/dL (ref 12.0–15.0)
Lymphocytes Relative: 30.7 % (ref 12.0–46.0)
Lymphs Abs: 2.3 10*3/uL (ref 0.7–4.0)
MCHC: 32.7 g/dL (ref 30.0–36.0)
MCV: 89.1 fl (ref 78.0–100.0)
Monocytes Absolute: 0.4 10*3/uL (ref 0.1–1.0)
Monocytes Relative: 5.5 % (ref 3.0–12.0)
Neutro Abs: 4.5 10*3/uL (ref 1.4–7.7)
Neutrophils Relative %: 61.3 % (ref 43.0–77.0)
Platelets: 205 10*3/uL (ref 150.0–400.0)
RBC: 4.95 Mil/uL (ref 3.87–5.11)
RDW: 14.2 % (ref 11.5–15.5)
WBC: 7.4 10*3/uL (ref 4.0–10.5)

## 2021-07-15 LAB — HEMOGLOBIN A1C: Hgb A1c MFr Bld: 5.5 % (ref 4.6–6.5)

## 2021-07-15 LAB — LIPID PANEL
Cholesterol: 199 mg/dL (ref 0–200)
HDL: 46 mg/dL (ref >39.00)
LDL Cholesterol: 136 mg/dL — ABNORMAL HIGH (ref 0–99)
NonHDL: 153.31
Total CHOL/HDL Ratio: 4
Triglycerides: 87 mg/dL (ref 0.0–149.0)
VLDL: 17.4 mg/dL (ref 0.0–40.0)

## 2021-07-15 LAB — TSH: TSH: 3.68 u[IU]/mL (ref 0.35–5.50)

## 2021-07-15 NOTE — Progress Notes (Signed)
Patient ID: Holly Oliver, female   DOB: 1980-06-07, 41 y.o.   MRN: [id number]   Subjective:    Patient ID: Holly Oliver, female    DOB: 22-Mar-1980, 41 y.o.   MRN: [id number]   Patient here for a scheduled follow up. Marland Kitchen   HPI Here to follow up regarding her weight.  She is concerned regarding not being able to lose weight.  Is gong to boot camp - 1x/week.  Is walking.  Going to the gym.  Eating at home more.  Trying to watch her diet.  No chest pain or sob reported.  No abdominal pain.  Bowels moving.  LMP 06/27/21.  Periods 1 26-28 days.  Handling stress.     Past Medical History:  Diagnosis Date   Family history of adverse reaction to anesthesia    Maternal Grandmother -PONV   Heart murmur    followed by PCP   History of chicken pox    Past Surgical History:  Procedure Laterality Date   CESAREAN SECTION  11/02/09   TONSILLECTOMY N/A 01/29/2017   Procedure: TONSILLECTOMY;  Surgeon: Beverly Gust, MD;  Location: [place];  Service: ENT;  Laterality: N/A;   Family History  Problem Relation Age of Onset   Hypertension Mother    Hyperlipidemia Mother    Hyperlipidemia Father    Lung cancer Maternal Grandfather    Prostate cancer Maternal Grandfather    Breast cancer Maternal Aunt        great aunt   Diabetes Maternal Aunt    Social History   Socioeconomic History   Marital status: Married    Spouse name: Not on file   Number of children: 1   Years of education: Not on file   Highest education level: Not on file  Occupational History   Occupation: Surveyor, quantity: [employer]  Tobacco Use   Smoking status: Never   Smokeless tobacco: Never  Vaping Use   Vaping Use: Never used  Substance and Sexual Activity   Alcohol use: Yes    Alcohol/week: 0.0 standard drinks of alcohol    Comment: Holidays   Drug use: No   Sexual activity: Not on file  Other Topics Concern   Not on file  Social History Narrative   Not on file   Social  Determinants of Health   Financial Resource Strain: Not on file  Food Insecurity: Not on file  Transportation Needs: Not on file  Physical Activity: Not on file  Stress: Not on file  Social Connections: Not on file     Review of Systems  Constitutional:  Negative for appetite change and unexpected weight change.  HENT:  Negative for congestion and sinus pressure.   Respiratory:  Negative for cough, chest tightness and shortness of breath.   Cardiovascular:  Negative for chest pain, palpitations and leg swelling.  Gastrointestinal:  Negative for abdominal pain, diarrhea, nausea and vomiting.  Genitourinary:  Negative for difficulty urinating and dysuria.  Musculoskeletal:  Negative for joint swelling and myalgias.  Skin:  Negative for color change and rash.  Neurological:  Negative for dizziness, light-headedness and headaches.  Psychiatric/Behavioral:  Negative for agitation and dysphoric mood.        Objective:     BP 124/80 (BP Location: Left Arm, Patient Position: Sitting, Cuff Size: Small)   Pulse (!) 103   Temp 97.9 F (36.6 C) (Temporal)   Resp 19   Ht 5' (1.524 m)   Wt 161 lb  3.2 oz (73.1 kg)   SpO2 98%   BMI 31.48 kg/m  Wt Readings from Last 3 Encounters:  07/15/21 161 lb 3.2 oz (73.1 kg)  10/10/20 153 lb 6.4 oz (69.6 kg)  07/14/19 138 lb 6.4 oz (62.8 kg)    Physical Exam Vitals reviewed.  Constitutional:      General: She is not in acute distress.    Appearance: Normal appearance.  HENT:     Head: Normocephalic and atraumatic.     Right Ear: External ear normal.     Left Ear: External ear normal.  Eyes:     General: No scleral icterus.       Right eye: No discharge.        Left eye: No discharge.     Conjunctiva/sclera: Conjunctivae normal.  Neck:     Thyroid: No thyromegaly.  Cardiovascular:     Rate and Rhythm: Normal rate and regular rhythm.  Pulmonary:     Effort: No respiratory distress.     Breath sounds: Normal breath sounds. No  wheezing.  Abdominal:     General: Bowel sounds are normal.     Palpations: Abdomen is soft.     Tenderness: There is no abdominal tenderness.  Musculoskeletal:        General: No swelling or tenderness.     Cervical back: Neck supple. No tenderness.  Lymphadenopathy:     Cervical: No cervical adenopathy.  Skin:    Findings: No erythema or rash.  Neurological:     Mental Status: She is alert.  Psychiatric:        Mood and Affect: Mood normal.        Behavior: Behavior normal.      Outpatient Encounter Medications as of 07/15/2021  Medication Sig   acyclovir (ZOVIRAX) 400 MG tablet Take 1 tablet (400 mg total) by mouth 3 (three) times daily. For 5 days.   acyclovir ointment (ZOVIRAX) 5 % Apply 1 application topically every 6 (six) hours as needed.   hydrocortisone (ANUSOL-HC) 25 MG suppository Place 1 suppository (25 mg total) rectally 2 (two) times daily.   traZODone (DESYREL) 50 MG tablet TAKE 0.5-1 TABLETS (25-50 MG TOTAL) BY MOUTH AT BEDTIME AS NEEDED FOR SLEEP.   No facility-administered encounter medications on file as of 07/15/2021.     Lab Results  Component Value Date   WBC 7.4 07/15/2021   HGB 14.4 07/15/2021   HCT 44.1 07/15/2021   PLT 205.0 07/15/2021   GLUCOSE 78 07/15/2021   CHOL 199 07/15/2021   TRIG 87.0 07/15/2021   HDL 46.00 07/15/2021   LDLCALC 136 (H) 07/15/2021   ALT 10 07/15/2021   AST 13 07/15/2021   NA 138 07/15/2021   K 4.0 07/15/2021   CL 105 07/15/2021   CREATININE 1.04 07/15/2021   BUN 13 07/15/2021   CO2 24 07/15/2021   TSH 3.68 07/15/2021   HGBA1C 5.5 07/15/2021       Assessment & Plan:   Problem List Items Addressed This Visit     Vaginal yeast infection   Relevant Orders   Comprehensive metabolic panel (Completed)   Hemoglobin A1c (Completed)   Weight gain - Primary    Concern regarding weight gain and not being able to lose weight.  Discussed diet and exercise.  She is walking.  Going to the gym.  Boot camp 1x/week.  Trying  to watch her diet.  Discussed treatment options, including metformin and GLP-1 agonist.  Check labs, including blood sugar, thyroid, etc.  Follow.       Relevant Orders   Comprehensive metabolic panel (Completed)   CBC with Differential/Platelet (Completed)   TSH (Completed)   Other Visit Diagnoses     Screening cholesterol level       Relevant Orders   Lipid panel (Completed)        Einar Pheasant, MD

## 2021-07-20 ENCOUNTER — Encounter: Payer: Self-pay | Admitting: Internal Medicine

## 2021-07-20 NOTE — Assessment & Plan Note (Signed)
Concern regarding weight gain and not being able to lose weight.  Discussed diet and exercise.  She is walking.  Going to the gym.  Boot camp 1x/week.  Trying to watch her diet.  Discussed treatment options, including metformin and GLP-1 agonist.  Check labs, including blood sugar, thyroid, etc.  Follow.

## 2021-08-19 ENCOUNTER — Ambulatory Visit: Payer: BC Managed Care – PPO | Admitting: Internal Medicine

## 2021-08-20 ENCOUNTER — Encounter: Payer: Self-pay | Admitting: Internal Medicine

## 2021-09-02 ENCOUNTER — Telehealth: Payer: BC Managed Care – PPO | Admitting: Internal Medicine

## 2021-09-02 ENCOUNTER — Encounter: Payer: Self-pay | Admitting: Internal Medicine

## 2021-09-02 DIAGNOSIS — R635 Abnormal weight gain: Secondary | ICD-10-CM | POA: Diagnosis not present

## 2021-09-02 MED ORDER — METFORMIN HCL ER 500 MG PO TB24: 500.0000 mg | ORAL_TABLET | Freq: Every day | ORAL | 2 refills | Status: DC

## 2021-09-02 NOTE — Progress Notes (Signed)
Patient ID: Holly Oliver, female   DOB: 03-02-1980, 41 y.o.   MRN: [id number]   Virtual Visit via video Note  All issues noted in this document were discussed and addressed.  No physical exam was performed (except for noted visual exam findings with Video Visits).   I connected with Holly Oliver today by a video enabled telemedicine application and verified that I am speaking with the correct person using two identifiers. Location patient: home Location provider: work Persons participating in the virtual visit: patient, provider  The limitations, risks, security and privacy concerns of performing an evaluation and management service by video and the availability of in person appointments have been discussed.  It has also been discussed with the patient that there may be a patient responsible charge related to this service. The patient expressed understanding and agreed to proceed.  Reason for visit:  work in appt  HPI: Work in to discuss weight loss - treatment options.  She reports she is going to the gym daily.  Watching what she eats.  Is frustrated because she is not losing weight.  Discussed diet and exercise.  Discussed treatment options including phentermine, GLP -1 receptor agonist.  Also discussed metformin - insulin resistance.      ROS: See pertinent positives and negatives per HPI.  Past Medical History:  Diagnosis Date   Family history of adverse reaction to anesthesia    Maternal Grandmother -PONV   Heart murmur    followed by PCP   History of chicken pox     Past Surgical History:  Procedure Laterality Date   CESAREAN SECTION  11/02/09   TONSILLECTOMY N/A 01/29/2017   Procedure: TONSILLECTOMY;  Surgeon: Beverly Gust, MD;  Location: [facility];  Service: ENT;  Laterality: N/A;    Family History  Problem Relation Age of Onset   Hypertension Mother    Hyperlipidemia Mother    Hyperlipidemia Father    Lung cancer Maternal Grandfather    Prostate  cancer Maternal Grandfather    Breast cancer Maternal Aunt        great aunt   Diabetes Maternal Aunt     SOCIAL HX: reviewed.    Current Outpatient Medications:    metFORMIN (GLUCOPHAGE-XR) 500 MG 24 hr tablet, Take 1 tablet (500 mg total) by mouth daily with breakfast., Disp: 30 tablet, Rfl: 2   traZODone (DESYREL) 50 MG tablet, TAKE 0.5-1 TABLETS (25-50 MG TOTAL) BY MOUTH AT BEDTIME AS NEEDED FOR SLEEP., Disp: 90 tablet, Rfl: 1  EXAM:  VITALS per patient if applicable:  GENERAL: alert, oriented, appears well and in no acute distress  HEENT: atraumatic, conjunttiva clear, no obvious abnormalities on inspection of external nose and ears  NECK: normal movements of the head and neck  LUNGS: on inspection no signs of respiratory distress, breathing rate appears normal, no obvious gross SOB, gasping or wheezing  CV: no obvious cyanosis  PSYCH/NEURO: pleasant and cooperative, no obvious depression or anxiety, speech and thought processing grossly intact  ASSESSMENT AND PLAN:  Discussed the following assessment and plan:  Problem List Items Addressed This Visit     Weight gain    Concern regarding weight gain and not being able to lose weight.  Discussed diet and exercise.  She is walking.  Going to the gym.  Boot camp.  Trying to watch her diet.  Discussed treatment options, including metformin and GLP-1 agonist.  Discussed phentermine.  Recent labs ok.  She elects trial of metformin.  Follow.  Return if symptoms worsen or fail to improve, for keep scheduled.   I discussed the assessment and treatment plan with the patient. The patient was provided an opportunity to ask questions and all were answered. The patient agreed with the plan and demonstrated an understanding of the instructions.   The patient was advised to call back or seek an in-person evaluation if the symptoms worsen or if the condition fails to improve as anticipated.    Einar Pheasant, MD

## 2021-09-08 ENCOUNTER — Encounter: Payer: Self-pay | Admitting: Internal Medicine

## 2021-09-08 NOTE — Assessment & Plan Note (Signed)
Concern regarding weight gain and not being able to lose weight.  Discussed diet and exercise.  She is walking.  Going to the gym.  Boot camp.  Trying to watch her diet.  Discussed treatment options, including metformin and GLP-1 agonist.  Discussed phentermine.  Recent labs ok.  She elects trial of metformin.  Follow.

## 2021-10-14 ENCOUNTER — Other Ambulatory Visit (HOSPITAL_COMMUNITY)
Admission: RE | Admit: 2021-10-14 | Discharge: 2021-10-14 | Disposition: A | Discharge status: Home / Self Care | Payer: BC Managed Care – PPO | Source: Ambulatory Visit | Attending: Internal Medicine | Admitting: Internal Medicine

## 2021-10-14 ENCOUNTER — Encounter: Payer: Self-pay | Admitting: Internal Medicine

## 2021-10-14 ENCOUNTER — Ambulatory Visit (INDEPENDENT_AMBULATORY_CARE_PROVIDER_SITE_OTHER): Payer: BC Managed Care – PPO | Admitting: Internal Medicine

## 2021-10-14 VITALS — BP 120/70 | HR 83 | Temp 98.5°F | Ht 60.0 in | Wt 161.6 lb

## 2021-10-14 DIAGNOSIS — R635 Abnormal weight gain: Secondary | ICD-10-CM | POA: Diagnosis not present

## 2021-10-14 DIAGNOSIS — E7800 Pure hypercholesterolemia, unspecified: Secondary | ICD-10-CM | POA: Diagnosis not present

## 2021-10-14 DIAGNOSIS — Z0000 Encounter for general adult medical examination without abnormal findings: Secondary | ICD-10-CM

## 2021-10-14 DIAGNOSIS — Z1231 Encounter for screening mammogram for malignant neoplasm of breast: Secondary | ICD-10-CM

## 2021-10-14 DIAGNOSIS — R195 Other fecal abnormalities: Secondary | ICD-10-CM

## 2021-10-14 NOTE — Assessment & Plan Note (Signed)
Physical today 10/14/21.  PAP 10/10/20 - negative with negative HPV.  Plant to schedule for mammogram.

## 2021-10-14 NOTE — Patient Instructions (Signed)
Mammogram 01/26/22 - 9:00 - [facility] [place]  [facility] - blood pressure cuff  Benefiber daily.

## 2021-10-14 NOTE — Progress Notes (Signed)
Patient ID: Holly Oliver, female   DOB: 05-21-1980, 41 y.o.   MRN: [id number]   Subjective:    Patient ID: Holly Oliver, female    DOB: 1980/12/26, 41 y.o.   MRN: [id number]   Patient here for  Chief Complaint  Patient presents with   Annual Exam   .   HPI Here for her physical exam.  Reports she is doing relatively well.  Increased stress at work.  Overall handling things well.  Has good support.  Has not been able to exercise as much.  Out of her routine.  Planning to restart.  Normal bowel movement q 2-3 days.  Has noticed this past week, some loose stool x 1 after eating.  No persistent diarrhea.  No blood.  No abdominal pain.  No vaginal symptoms or urinary symptoms.     Past Medical History:  Diagnosis Date   Family history of adverse reaction to anesthesia    Maternal Grandmother -PONV   Heart murmur    followed by PCP   History of chicken pox    Past Surgical History:  Procedure Laterality Date   CESAREAN SECTION  11/02/09   TONSILLECTOMY N/A 01/29/2017   Procedure: TONSILLECTOMY;  Surgeon: Beverly Gust, MD;  Location: [place];  Service: ENT;  Laterality: N/A;   Family History  Problem Relation Age of Onset   Hypertension Mother    Hyperlipidemia Mother    Hyperlipidemia Father    Lung cancer Maternal Grandfather    Prostate cancer Maternal Grandfather    Breast cancer Maternal Aunt        great aunt   Diabetes Maternal Aunt    Social History   Socioeconomic History   Marital status: Married    Spouse name: Not on file   Number of children: 1   Years of education: Not on file   Highest education level: Not on file  Occupational History   Occupation: Surveyor, quantity: [employer]  Tobacco Use   Smoking status: Never   Smokeless tobacco: Never  Vaping Use   Vaping Use: Never used  Substance and Sexual Activity   Alcohol use: Yes    Alcohol/week: 0.0 standard drinks of alcohol    Comment: Holidays   Drug use: No    Sexual activity: Not on file  Other Topics Concern   Not on file  Social History Narrative   Not on file   Social Determinants of Health   Financial Resource Strain: Not on file  Food Insecurity: Not on file  Transportation Needs: Not on file  Physical Activity: Not on file  Stress: Not on file  Social Connections: Not on file     Review of Systems  Constitutional:  Negative for appetite change and unexpected weight change.  HENT:  Negative for congestion, sinus pressure and sore throat.   Eyes:  Negative for pain and visual disturbance.  Respiratory:  Negative for cough, chest tightness and shortness of breath.   Cardiovascular:  Negative for chest pain, palpitations and leg swelling.  Gastrointestinal:  Negative for abdominal pain, nausea and vomiting.  Genitourinary:  Negative for difficulty urinating and dysuria.  Musculoskeletal:  Negative for joint swelling and myalgias.  Skin:  Negative for color change and rash.  Neurological:  Negative for dizziness, light-headedness and headaches.  Hematological:  Negative for adenopathy. Does not bruise/bleed easily.  Psychiatric/Behavioral:  Negative for agitation and dysphoric mood.        Objective:  BP 120/70 (BP Location: Right Arm, Patient Position: Sitting, Cuff Size: Normal)   Pulse 83   Temp 98.5 F (36.9 C) (Oral)   Ht 5' (1.524 m)   Wt 161 lb 9.6 oz (73.3 kg)   LMP 10/09/2021 (Exact Date)   SpO2 97%   BMI 31.56 kg/m  Wt Readings from Last 3 Encounters:  10/14/21 161 lb 9.6 oz (73.3 kg)  09/02/21 161 lb 3.2 oz (73.1 kg)  07/15/21 161 lb 3.2 oz (73.1 kg)    Physical Exam Vitals reviewed.  Constitutional:      General: She is not in acute distress.    Appearance: Normal appearance. She is well-developed.  HENT:     Head: Normocephalic and atraumatic.     Right Ear: External ear normal.     Left Ear: External ear normal.  Eyes:     General: No scleral icterus.       Right eye: No discharge.         Left eye: No discharge.     Conjunctiva/sclera: Conjunctivae normal.  Neck:     Thyroid: No thyromegaly.  Cardiovascular:     Rate and Rhythm: Normal rate and regular rhythm.  Pulmonary:     Effort: No tachypnea, accessory muscle usage or respiratory distress.     Breath sounds: Normal breath sounds. No decreased breath sounds or wheezing.  Chest:  Breasts:    Right: No inverted nipple, mass, nipple discharge or tenderness (no axillary adenopathy).     Left: No inverted nipple, mass, nipple discharge or tenderness (no axilarry adenopathy).  Abdominal:     General: Bowel sounds are normal.     Palpations: Abdomen is soft.     Tenderness: There is no abdominal tenderness.  Genitourinary:    Comments: Normal external genitalia.  Vaginal vault without lesions.  Cervix identified.  Pap smear performed.  Could not appreciate any adnexal masses or tenderness.   Musculoskeletal:        General: No swelling or tenderness.     Cervical back: Neck supple.  Lymphadenopathy:     Cervical: No cervical adenopathy.  Skin:    Findings: No erythema or rash.  Neurological:     Mental Status: She is alert and oriented to person, place, and time.  Psychiatric:        Mood and Affect: Mood normal.        Behavior: Behavior normal.      Outpatient Encounter Medications as of 10/14/2021  Medication Sig   metFORMIN (GLUCOPHAGE-XR) 500 MG 24 hr tablet Take 1 tablet (500 mg total) by mouth daily with breakfast.   traZODone (DESYREL) 50 MG tablet TAKE 0.5-1 TABLETS (25-50 MG TOTAL) BY MOUTH AT BEDTIME AS NEEDED FOR SLEEP.   No facility-administered encounter medications on file as of 10/14/2021.     Lab Results  Component Value Date   WBC 7.4 07/15/2021   HGB 14.4 07/15/2021   HCT 44.1 07/15/2021   PLT 205.0 07/15/2021   GLUCOSE 78 07/15/2021   CHOL 199 07/15/2021   TRIG 87.0 07/15/2021   HDL 46.00 07/15/2021   LDLCALC 136 (H) 07/15/2021   ALT 10 07/15/2021   AST 13 07/15/2021   NA 138  07/15/2021   K 4.0 07/15/2021   CL 105 07/15/2021   CREATININE 1.04 07/15/2021   BUN 13 07/15/2021   CO2 24 07/15/2021   TSH 3.68 07/15/2021   HGBA1C 5.5 07/15/2021       Assessment & Plan:   Problem List  Items Addressed This Visit     Health care maintenance    Physical today 10/14/21.  PAP 10/10/20 - negative with negative HPV.  Plant to schedule for mammogram.       Relevant Orders   Cytology - PAP( [facility]) (Completed)   Hypercholesteremia    Low cholesterol diet and exercise.  Follow lipid panel.       Relevant Orders   Lipid panel   Comprehensive metabolic panel   Loose stools    Stools as outlined.  benefiber as directed.  Follow.       Weight gain    Have discussed diet and exercise. Planning to get back into more of her routine  Follow.       Other Visit Diagnoses     Routine general medical examination at a health care facility    -  Primary   Encounter for screening mammogram for malignant neoplasm of breast       Relevant Orders   MM 3D SCREEN BREAST BILATERAL        Einar Pheasant, MD

## 2021-10-16 LAB — CYTOLOGY - PAP
Comment: NEGATIVE
Diagnosis: NEGATIVE
High risk HPV: NEGATIVE

## 2021-10-20 ENCOUNTER — Encounter: Payer: Self-pay | Admitting: Internal Medicine

## 2021-10-20 DIAGNOSIS — R195 Other fecal abnormalities: Secondary | ICD-10-CM | POA: Insufficient documentation

## 2021-10-20 NOTE — Assessment & Plan Note (Signed)
Stools as outlined.  benefiber as directed.  Follow.

## 2021-10-20 NOTE — Assessment & Plan Note (Signed)
Have discussed diet and exercise. Planning to get back into more of her routine  Follow.

## 2021-10-20 NOTE — Assessment & Plan Note (Signed)
Low cholesterol diet and exercise.  Follow lipid panel.   

## 2021-11-29 ENCOUNTER — Other Ambulatory Visit: Payer: Self-pay | Admitting: Internal Medicine

## 2021-12-09 ENCOUNTER — Encounter: Payer: Self-pay | Admitting: Internal Medicine

## 2021-12-17 ENCOUNTER — Telehealth (INDEPENDENT_AMBULATORY_CARE_PROVIDER_SITE_OTHER): Payer: BC Managed Care – PPO | Admitting: Internal Medicine

## 2021-12-17 ENCOUNTER — Encounter: Payer: Self-pay | Admitting: Internal Medicine

## 2021-12-17 VITALS — Ht 60.0 in | Wt 161.0 lb

## 2021-12-17 DIAGNOSIS — E7800 Pure hypercholesterolemia, unspecified: Secondary | ICD-10-CM | POA: Diagnosis not present

## 2021-12-17 DIAGNOSIS — R635 Abnormal weight gain: Secondary | ICD-10-CM | POA: Diagnosis not present

## 2021-12-17 MED ORDER — WEGOVY 0.25 MG/0.5ML ~~LOC~~ SOAJ: 0.2500 mg | SUBCUTANEOUS | 2 refills | Status: DC

## 2021-12-17 NOTE — Progress Notes (Signed)
Patient ID: Holly Oliver, female   DOB: Feb 09, 1981, 41 y.o.   MRN: [id number]   Virtual Visit via video Note    All issues noted in this document were discussed and addressed.  No physical exam was performed (except for noted visual exam findings with Video Visits).   I connected with Monika Chestang today by a video enabled telemedicine application and verified that I am speaking with the correct person using two identifiers. Location patient: home Location provider: work  Persons participating in the virtual visit: patient, provider  The limitations, risks, security and privacy concerns of performing an evaluation and management service by video and the availability of in person appointments have been discussed.  It has also been discussed with the patient that there may be a patient responsible charge related to this service. The patient expressed understanding and agreed to proceed.   Reason for visit: work in appt  HPI: Work in to discuss weight loss medication. Has been watching her diet.  Exercising.  Has been on metformin.  Hard to remember to take the medication.  Is interested in starting on wegovy.  Discussed diet and exercise.  Discussed wegovy and possible side effects.  Reports she is doing relatively well otherwise.  Getting some help at work.  Handling stress.     ROS: See pertinent positives and negatives per HPI.  Past Medical History:  Diagnosis Date   Family history of adverse reaction to anesthesia    Maternal Grandmother -PONV   Heart murmur    followed by PCP   History of chicken pox     Past Surgical History:  Procedure Laterality Date   CESAREAN SECTION  11/02/09   TONSILLECTOMY N/A 01/29/2017   Procedure: TONSILLECTOMY;  Surgeon: Beverly Gust, MD;  Location: [place];  Service: ENT;  Laterality: N/A;    Family History  Problem Relation Age of Onset   Hypertension Mother    Hyperlipidemia Mother    Hyperlipidemia Father    Lung  cancer Maternal Grandfather    Prostate cancer Maternal Grandfather    Breast cancer Maternal Aunt        great aunt   Diabetes Maternal Aunt     SOCIAL HX: reviewed.    Current Outpatient Medications:    Semaglutide-Weight Management (WEGOVY) 0.25 MG/0.5ML SOAJ, Inject 0.25 mg into the skin once a week., Disp: 2 mL, Rfl: 2   traZODone (DESYREL) 50 MG tablet, TAKE 0.5-1 TABLETS (25-50 MG TOTAL) BY MOUTH AT BEDTIME AS NEEDED FOR SLEEP., Disp: 90 tablet, Rfl: 1  EXAM:  GENERAL: alert, oriented, appears well and in no acute distress  HEENT: atraumatic, conjunttiva clear, no obvious abnormalities on inspection of external nose and ears  NECK: normal movements of the head and neck  LUNGS: on inspection no signs of respiratory distress, breathing rate appears normal, no obvious gross SOB, gasping or wheezing  CV: no obvious cyanosis  PSYCH/NEURO: pleasant and cooperative, no obvious depression or anxiety, speech and thought processing grossly intact  ASSESSMENT AND PLAN:  Discussed the following assessment and plan:  Problem List Items Addressed This Visit     Hypercholesteremia    Low cholesterol diet and exercise.  Follow lipid panel.       Weight gain - Primary    Concerned regarding weight and desires weight loss.  Is exercising.  Watching her diet.  On metformin.  Hard to remember to take.  Wants to try wegovy.  Discussed the medication and possible side effects of  the medication. Will stop metformin.  Start wegovy .'25mg'$ .  Follow.  Continue diet and exercise.        Return in about 2 months (around 02/16/2022) for follow up appt (51mn).   I discussed the assessment and treatment plan with the patient. The patient was provided an opportunity to ask questions and all were answered. The patient agreed with the plan and demonstrated an understanding of the instructions.   The patient was advised to call back or seek an in-person evaluation if the symptoms worsen or if the  condition fails to improve as anticipated.   CEinar Pheasant MD

## 2021-12-22 ENCOUNTER — Encounter: Payer: Self-pay | Admitting: Internal Medicine

## 2021-12-22 NOTE — Assessment & Plan Note (Signed)
Low cholesterol diet and exercise.  Follow lipid panel.   

## 2021-12-22 NOTE — Assessment & Plan Note (Addendum)
Concerned regarding weight and desires weight loss.  Is exercising.  Watching her diet.  On metformin.  Hard to remember to take.  Wants to try wegovy.  Discussed the medication and possible side effects of the medication. Will stop metformin.  Start wegovy .'25mg'$ .  Follow.  Continue diet and exercise.

## 2021-12-23 ENCOUNTER — Encounter: Payer: Self-pay | Admitting: Internal Medicine

## 2021-12-24 ENCOUNTER — Other Ambulatory Visit: Payer: Self-pay | Admitting: Internal Medicine

## 2022-01-26 ENCOUNTER — Other Ambulatory Visit: Payer: BC Managed Care – PPO

## 2022-03-05 NOTE — Telephone Encounter (Signed)
I spoke with patient today regarding scheduling a follow-up appointment from her [place] visit with Dr. Einar Pheasant on 12/17/2021.  Patient states she has not received her Mancel Parsons yet, and she believes the appointment was to see how she was doing with the [facility]  Patient states she will be glad to meet with Dr. Nicki Reaper if she still wants to see her, but it may not be necessary since she hasn't started taking the medication.

## 2022-03-10 ENCOUNTER — Ambulatory Visit
Admission: RE | Admit: 2022-03-10 | Discharge: 2022-03-10 | Disposition: A | Discharge status: Home / Self Care | Payer: BC Managed Care – PPO | Source: Ambulatory Visit | Attending: Internal Medicine | Admitting: Internal Medicine

## 2022-03-10 DIAGNOSIS — Z1231 Encounter for screening mammogram for malignant neoplasm of breast: Secondary | ICD-10-CM | POA: Insufficient documentation

## 2022-03-12 ENCOUNTER — Other Ambulatory Visit: Payer: Self-pay | Admitting: Internal Medicine

## 2022-03-12 DIAGNOSIS — R928 Other abnormal and inconclusive findings on diagnostic imaging of breast: Secondary | ICD-10-CM

## 2022-03-12 DIAGNOSIS — N6489 Other specified disorders of breast: Secondary | ICD-10-CM

## 2022-03-18 ENCOUNTER — Ambulatory Visit
Admission: RE | Admit: 2022-03-18 | Discharge: 2022-03-18 | Disposition: A | Discharge status: Home / Self Care | Payer: BC Managed Care – PPO | Source: Ambulatory Visit | Attending: Internal Medicine | Admitting: Internal Medicine

## 2022-03-18 DIAGNOSIS — R928 Other abnormal and inconclusive findings on diagnostic imaging of breast: Secondary | ICD-10-CM | POA: Diagnosis present

## 2022-03-18 DIAGNOSIS — N6489 Other specified disorders of breast: Secondary | ICD-10-CM

## 2022-03-19 ENCOUNTER — Other Ambulatory Visit: Payer: Self-pay | Admitting: Internal Medicine

## 2022-03-19 DIAGNOSIS — R928 Other abnormal and inconclusive findings on diagnostic imaging of breast: Secondary | ICD-10-CM

## 2022-03-19 DIAGNOSIS — N63 Unspecified lump in unspecified breast: Secondary | ICD-10-CM

## 2022-03-21 ENCOUNTER — Other Ambulatory Visit: Payer: Self-pay | Admitting: Internal Medicine

## 2022-03-24 ENCOUNTER — Other Ambulatory Visit: Payer: Self-pay | Admitting: Diagnostic Radiology

## 2022-03-24 ENCOUNTER — Ambulatory Visit
Admission: RE | Admit: 2022-03-24 | Discharge: 2022-03-24 | Disposition: A | Discharge status: Home / Self Care | Payer: BC Managed Care – PPO | Source: Ambulatory Visit | Attending: Internal Medicine | Admitting: Internal Medicine

## 2022-03-24 ENCOUNTER — Other Ambulatory Visit: Payer: Self-pay | Admitting: Internal Medicine

## 2022-03-24 DIAGNOSIS — R928 Other abnormal and inconclusive findings on diagnostic imaging of breast: Secondary | ICD-10-CM | POA: Insufficient documentation

## 2022-03-24 DIAGNOSIS — N6311 Unspecified lump in the right breast, upper outer quadrant: Secondary | ICD-10-CM | POA: Insufficient documentation

## 2022-03-24 DIAGNOSIS — N63 Unspecified lump in unspecified breast: Secondary | ICD-10-CM | POA: Insufficient documentation

## 2022-03-24 HISTORY — PX: BREAST BIOPSY: SHX20

## 2022-03-24 MED ORDER — LIDOCAINE HCL (PF) 1 % IJ SOLN
2.0000 mL | Freq: Once | INTRAMUSCULAR | Status: AC
  Administered 2022-03-24: 2 mL

## 2022-03-24 MED ORDER — LIDOCAINE-EPINEPHRINE 1 %-1:100000 IJ SOLN
10.0000 mL | Freq: Once | INTRAMUSCULAR | Status: AC
  Administered 2022-03-24: 10 mL

## 2022-03-26 LAB — SURGICAL PATHOLOGY

## 2022-03-27 ENCOUNTER — Other Ambulatory Visit: Payer: Self-pay | Admitting: Internal Medicine

## 2022-03-27 DIAGNOSIS — N63 Unspecified lump in unspecified breast: Secondary | ICD-10-CM

## 2022-03-27 DIAGNOSIS — R928 Other abnormal and inconclusive findings on diagnostic imaging of breast: Secondary | ICD-10-CM

## 2022-03-29 ENCOUNTER — Other Ambulatory Visit: Payer: Self-pay | Admitting: Internal Medicine

## 2022-03-29 DIAGNOSIS — R928 Other abnormal and inconclusive findings on diagnostic imaging of breast: Secondary | ICD-10-CM

## 2022-03-29 NOTE — Progress Notes (Signed)
Order placed for referral to surgery - Dr Barry Dienes for abnormal mammogram.

## 2022-04-14 ENCOUNTER — Encounter: Payer: Self-pay | Admitting: Internal Medicine

## 2022-04-14 ENCOUNTER — Ambulatory Visit: Payer: BC Managed Care – PPO | Admitting: Internal Medicine

## 2022-04-14 VITALS — BP 118/70 | HR 74 | Temp 98.0°F | Resp 16 | Ht 60.0 in | Wt 167.8 lb

## 2022-04-14 DIAGNOSIS — R635 Abnormal weight gain: Secondary | ICD-10-CM | POA: Diagnosis not present

## 2022-04-14 DIAGNOSIS — E7800 Pure hypercholesterolemia, unspecified: Secondary | ICD-10-CM

## 2022-04-14 DIAGNOSIS — F439 Reaction to severe stress, unspecified: Secondary | ICD-10-CM

## 2022-04-14 MED ORDER — ZEPBOUND 2.5 MG/0.5ML ~~LOC~~ SOAJ: 2.5000 mg | SUBCUTANEOUS | 2 refills | Status: DC

## 2022-04-14 MED ORDER — SERTRALINE HCL 25 MG PO TABS: 25.0000 mg | ORAL_TABLET | Freq: Every day | ORAL | 2 refills | Status: DC

## 2022-04-14 NOTE — Progress Notes (Signed)
Subjective:    Patient ID: Holly Oliver, female    DOB: Jun 10, 1980, 42 y.o.   MRN: [id number]  Patient here for  Chief Complaint  Patient presents with   Medical Management of Chronic Issues    HPI Here to follow up regarding hypercholesterolemia, weight loss.  Also with recent abnormal mammogram.  Discussed mammogram.  She had biopsy through radiology.  Recommendation was for two further biopsies, referral to surgery and breast MRI.  Given above = decided to refer to surgery for further evaluation and discuss plan for above evaluation.  Has appt tomorrow.  Increased stress.  Son - arm fracture.  Increased stress with work and with her mammogram.  Discussed possible interventions.  Discussed counseling and medication.  Does not feel needs counseling.  Did agree - start zoloft.  Also discussed weight loss medication.  Unable to get wegovy.  Agreeable - trial of zepbound.  Did report recent headache.  Resolved now.  No dizziness.     Past Medical History:  Diagnosis Date   Family history of adverse reaction to anesthesia    Maternal Grandmother -PONV   Heart murmur    followed by PCP   History of chicken pox    Past Surgical History:  Procedure Laterality Date   BREAST BIOPSY Right 03/24/2022   10:00 4cmfn heart clip path pending   BREAST BIOPSY Right 03/24/2022   11:00 1cmfn venus paht pending   BREAST BIOPSY Right 03/24/2022   [place] RT BREAST BX W LOC DEV 1ST LESION IMG BX SPEC US GUIDE 03/24/2022 [facility]   BREAST BIOPSY Right 03/24/2022   [place] RT BREAST BX W LOC DEV EA ADD LESION IMG BX SPEC US GUIDE 03/24/2022 [facility]   CESAREAN SECTION  11/02/2009   TONSILLECTOMY N/A 01/29/2017   Procedure: TONSILLECTOMY;  Surgeon: Beverly Gust, MD;  Location: [place];  Service: ENT;  Laterality: N/A;   Family History  Problem Relation Age of Onset   Hypertension Mother    Hyperlipidemia Mother    Hyperlipidemia Father    Lung cancer Maternal Grandfather     Prostate cancer Maternal Grandfather    Breast cancer Maternal Aunt        great aunt   Diabetes Maternal Aunt    Social History   Socioeconomic History   Marital status: Married    Spouse name: Not on file   Number of children: 1   Years of education: Not on file   Highest education level: Not on file  Occupational History   Occupation: Surveyor, quantity: [employer]  Tobacco Use   Smoking status: Never   Smokeless tobacco: Never  Vaping Use   Vaping Use: Never used  Substance and Sexual Activity   Alcohol use: Yes    Alcohol/week: 0.0 standard drinks of alcohol    Comment: Holidays   Drug use: No   Sexual activity: Not on file  Other Topics Concern   Not on file  Social History Narrative   Not on file   Social Determinants of Health   Financial Resource Strain: Not on file  Food Insecurity: Not on file  Transportation Needs: Not on file  Physical Activity: Not on file  Stress: Not on file  Social Connections: Not on file     Review of Systems  Constitutional:  Negative for appetite change and unexpected weight change.  HENT:  Negative for congestion and sinus pressure.   Respiratory:  Negative for cough, chest tightness and shortness  of breath.   Cardiovascular:  Negative for chest pain, palpitations and leg swelling.  Gastrointestinal:  Negative for abdominal pain, diarrhea, nausea and vomiting.  Genitourinary:  Negative for difficulty urinating and dysuria.  Musculoskeletal:  Negative for joint swelling and myalgias.  Skin:  Negative for color change and rash.  Neurological:  Negative for dizziness and headaches.  Psychiatric/Behavioral:  Negative for agitation and dysphoric mood.        Increased stress as outlined.        Objective:     BP 118/70   Pulse 74   Temp 98 F (36.7 C)   Resp 16   Ht 5' (1.524 m)   Wt 167 lb 12.8 oz (76.1 kg)   SpO2 98%   BMI 32.77 kg/m  Wt Readings from Last 3 Encounters:  04/14/22 167 lb 12.8 oz (76.1  kg)  12/17/21 161 lb (73 kg)  10/14/21 161 lb 9.6 oz (73.3 kg)    Physical Exam Vitals reviewed.  Constitutional:      General: She is not in acute distress.    Appearance: Normal appearance.  HENT:     Head: Normocephalic and atraumatic.     Right Ear: External ear normal.     Left Ear: External ear normal.  Eyes:     General: No scleral icterus.       Right eye: No discharge.        Left eye: No discharge.     Conjunctiva/sclera: Conjunctivae normal.  Neck:     Thyroid: No thyromegaly.  Cardiovascular:     Rate and Rhythm: Normal rate and regular rhythm.  Pulmonary:     Effort: No respiratory distress.     Breath sounds: Normal breath sounds. No wheezing.  Abdominal:     General: Bowel sounds are normal.     Palpations: Abdomen is soft.     Tenderness: There is no abdominal tenderness.  Musculoskeletal:        General: No swelling or tenderness.     Cervical back: Neck supple. No tenderness.  Lymphadenopathy:     Cervical: No cervical adenopathy.  Skin:    Findings: No erythema or rash.  Neurological:     Mental Status: She is alert.  Psychiatric:        Mood and Affect: Mood normal.        Behavior: Behavior normal.      Outpatient Encounter Medications as of 04/14/2022  Medication Sig   sertraline (ZOLOFT) 25 MG tablet Take 1 tablet (25 mg total) by mouth daily.   tirzepatide (ZEPBOUND) 2.5 MG/0.5ML Pen Inject 2.5 mg into the skin once a week.   [DISCONTINUED] Semaglutide-Weight Management (WEGOVY) 0.25 MG/0.5ML SOAJ Inject 0.25 mg into the skin once a week. (Patient not taking: Reported on 04/14/2022)   [DISCONTINUED] traZODone (DESYREL) 50 MG tablet TAKE 0.5-1 TABLETS (25-50 MG TOTAL) BY MOUTH AT BEDTIME AS NEEDED FOR SLEEP. (Patient not taking: Reported on 04/14/2022)   No facility-administered encounter medications on file as of 04/14/2022.     Lab Results  Component Value Date   WBC 7.4 07/15/2021   HGB 14.4 07/15/2021   HCT 44.1 07/15/2021   PLT 205.0  07/15/2021   GLUCOSE 78 07/15/2021   CHOL 199 07/15/2021   TRIG 87.0 07/15/2021   HDL 46.00 07/15/2021   LDLCALC 136 (H) 07/15/2021   ALT 10 07/15/2021   AST 13 07/15/2021   NA 138 07/15/2021   K 4.0 07/15/2021   CL 105 07/15/2021  CREATININE 1.04 07/15/2021   BUN 13 07/15/2021   CO2 24 07/15/2021   TSH 3.68 07/15/2021   HGBA1C 5.5 07/15/2021    [place] RT BREAST BX W LOC DEV 1ST LESION IMG BX SPEC US GUIDE  Addendum Date: 03/26/2022   ADDENDUM REPORT: 03/26/2022 13:27 ADDENDUM: PATHOLOGY revealed: Site A. 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 Comment: Excisional biopsy is recommended for complete evaluation of this lesion and exclusion of atypia. Pathology results are CONCORDANT with imaging findings, per Dr. Nolon Nations with possible excision recommended. PATHOLOGY revealed: Site B. 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 - NEGATIVE FOR ATYPICAL PROLIFERATIVE BREAST DISEASE IN THIS SAMPLE. Comment: Immunohistochemical studies were performed. Calponin and p63 demonstrate intact myoepithelial marking. There is no evidence of invasive carcinoma. CK5/6 is diffusely positive within the epithelial proliferation, while ER shows only patchy marking. This immunophenotypic profile is typical of usual ductal hyperplasia. Diagnostic considerations include (but are not limited to) a complex fibroadenoma, intraductal papilloma with sclerosis, or complex sclerosing lesion. Although atypia is not present in this biopsy, it cannot be excluded elsewhere in this lesion. Correlation with radiographic findings is required. Pathology results are CONCORDANT with  imaging findings, per Dr. Nolon Nations with possible excision recommended. Pathology results and recommendations below were discussed with patient by telephone on 03/26/2022. Patient reported biopsy site within normal limits with slight tenderness at the site. Post biopsy care instructions were reviewed, questions were answered and my direct phone number was provided to patient. Patient was instructed to call [facility] if any concerns or questions arise related to the biopsy. RECOMMENDATIONS: 1. Please schedule patient for two additional ultrasound guided RIGHT breast biopsies, for oval hypoechoic mass located in the 11 o'clock location 4 centimeters from the nipple, and an irregular mass in the 10 o'clock location 1 centimeter from the nipple. 2. Surgical consultation for consideration of excision. Please schedule consult for after results of additional biopsies. 3. Consider bilateral breast MRI with and without contrast, given multiple areas of concern, diagnosis of papilloma, and patient's relatively young age. Pathology results reported by Electa Sniff RN on 03/26/2022. Electronically Signed   By: Nolon Nations M.D.   On: 03/26/2022 13:27   Addendum Date: 03/24/2022   ADDENDUM REPORT: 03/24/2022 11:51 ADDENDUM: Evaluation of the RIGHT axilla shows no adenopathy. Electronically Signed   By: Nolon Nations M.D.   On: 03/24/2022 11:51   Result Date: 03/26/2022 CLINICAL DATA:  Patient presents for ultrasound-guided core biopsy of RIGHT breast mass. EXAM: ULTRASOUND GUIDED RIGHT BREAST CORE NEEDLE BIOPSY x2 COMPARISON:  Previous exam(s). PROCEDURE: Prior to procedure, scout imaging is performed in preparation for the biopsy. These views demonstrate additional masses in the UPPER-OUTER QUADRANT of the RIGHT breast. In the 11 o'clock location of the RIGHT breast 1 centimeter from the nipple, an irregular is 0.8 x 0.5 x 0.6 centimeters. (Biopsied today) In the 11 o'clock location 4 centimeters from  the nipple, an oval hypoechoic mass is 0.5 x 0.5 x 0 5 centimeters. In the 10 o'clock location 1 centimeter from the nipple, an irregular mass is 0.8 x 0.7 x 0.8 centimeters. In the 10 o'clock location 4 centimeters from the nipple, an irregular hypoechoic mass is 0.4 x 0.5 x  0.4 centimeters. (Biopsied today) I met with the patient and we discussed the procedure of ultrasound-guided biopsy, including benefits and alternatives. We discussed the high likelihood of a successful procedure. We discussed the risks of the procedure, including infection, bleeding, tissue injury, clip migration, and inadequate sampling. Informed written consent was given. The usual time-out protocol was performed immediately prior to the procedure. Site 1: 10 o'clock 4 centimeters from the nipple. Lesion quadrant: UPPER-OUTER QUADRANT RIGHT breast, heart clip Using sterile technique and lidocaine and lidocaine with epinephrine as local anesthetic, under direct ultrasound visualization, a 12 gauge spring-loaded device was used to perform biopsy of mass in the 10 o'clock location of the RIGHT breast 4 centimeters from the nipple using a inferior to superior approach. At the conclusion of the procedure heart tissue marker clip was deployed into the biopsy cavity. Site 2: 11 o'clock 1 centimeter from the nipple. Lesion quadrant: UPPER-OUTER QUADRANT, venus clip Using sterile technique and lidocaine and lidocaine with epinephrine as local anesthetic, under direct ultrasound visualization, a 12 gauge spring-loaded device was used to perform biopsy of mass in the 11 o'clock location of the RIGHT breast 1 centimeter from the nipple using a inferior to superior approach. At the conclusion of the procedure venus tissue marker clip was deployed into the biopsy cavity. Follow-up 2-view mammogram was performed and dictated separately. IMPRESSION: Ultrasound guided biopsy of 2 masses in the RIGHT breast. Of note 2 additional masses are also identified but  not biopsied today. No apparent complications. Electronically Signed: By: Nolon Nations M.D. On: 03/24/2022 09:54   [place] RT BREAST BX W LOC DEV EA ADD LESION IMG BX SPEC US GUIDE  Addendum Date: 03/26/2022   ADDENDUM REPORT: 03/26/2022 13:27 ADDENDUM: PATHOLOGY revealed: Site A. 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 Comment: Excisional biopsy is recommended for complete evaluation of this lesion and exclusion of atypia. Pathology results are CONCORDANT with imaging findings, per Dr. Nolon Nations with possible excision recommended. PATHOLOGY revealed: Site B. 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 - NEGATIVE FOR ATYPICAL PROLIFERATIVE BREAST DISEASE IN THIS SAMPLE. Comment: Immunohistochemical studies were performed. Calponin and p63 demonstrate intact myoepithelial marking. There is no evidence of invasive carcinoma. CK5/6 is diffusely positive within the epithelial proliferation, while ER shows only patchy marking. This immunophenotypic profile is typical of usual ductal hyperplasia. Diagnostic considerations include (but are not limited to) a complex fibroadenoma, intraductal papilloma with sclerosis, or complex sclerosing lesion. Although atypia is not present in this biopsy, it cannot be excluded elsewhere in this lesion. Correlation with radiographic findings is required. Pathology results are CONCORDANT with imaging findings, per Dr. Nolon Nations with possible excision recommended. Pathology results and recommendations below were discussed with patient by telephone on 03/26/2022. Patient reported biopsy site within normal limits with slight  tenderness at the site. Post biopsy care instructions were reviewed, questions were answered and my direct phone number was provided to patient. Patient was instructed to call [facility] if any concerns or questions arise related to the biopsy. RECOMMENDATIONS: 1. Please schedule patient for two additional ultrasound guided RIGHT breast biopsies, for oval hypoechoic mass located in the 11 o'clock location 4 centimeters from the nipple, and an irregular mass in the 10 o'clock  location 1 centimeter from the nipple. 2. Surgical consultation for consideration of excision. Please schedule consult for after results of additional biopsies. 3. Consider bilateral breast MRI with and without contrast, given multiple areas of concern, diagnosis of papilloma, and patient's relatively young age. Pathology results reported by Electa Sniff RN on 03/26/2022. Electronically Signed   By: Nolon Nations M.D.   On: 03/26/2022 13:27   Addendum Date: 03/24/2022   ADDENDUM REPORT: 03/24/2022 11:51 ADDENDUM: Evaluation of the RIGHT axilla shows no adenopathy. Electronically Signed   By: Nolon Nations M.D.   On: 03/24/2022 11:51   Result Date: 03/26/2022 CLINICAL DATA:  Patient presents for ultrasound-guided core biopsy of RIGHT breast mass. EXAM: ULTRASOUND GUIDED RIGHT BREAST CORE NEEDLE BIOPSY x2 COMPARISON:  Previous exam(s). PROCEDURE: Prior to procedure, scout imaging is performed in preparation for the biopsy. These views demonstrate additional masses in the UPPER-OUTER QUADRANT of the RIGHT breast. In the 11 o'clock location of the RIGHT breast 1 centimeter from the nipple, an irregular is 0.8 x 0.5 x 0.6 centimeters. (Biopsied today) In the 11 o'clock location 4 centimeters from the nipple, an oval hypoechoic mass is 0.5 x 0.5 x 0 5 centimeters. In the 10 o'clock location 1 centimeter from the nipple, an irregular mass is 0.8 x 0.7 x 0.8 centimeters. In the 10 o'clock location 4 centimeters from the nipple, an  irregular hypoechoic mass is 0.4 x 0.5 x 0.4 centimeters. (Biopsied today) I met with the patient and we discussed the procedure of ultrasound-guided biopsy, including benefits and alternatives. We discussed the high likelihood of a successful procedure. We discussed the risks of the procedure, including infection, bleeding, tissue injury, clip migration, and inadequate sampling. Informed written consent was given. The usual time-out protocol was performed immediately prior to the procedure. Site 1: 10 o'clock 4 centimeters from the nipple. Lesion quadrant: UPPER-OUTER QUADRANT RIGHT breast, heart clip Using sterile technique and lidocaine and lidocaine with epinephrine as local anesthetic, under direct ultrasound visualization, a 12 gauge spring-loaded device was used to perform biopsy of mass in the 10 o'clock location of the RIGHT breast 4 centimeters from the nipple using a inferior to superior approach. At the conclusion of the procedure heart tissue marker clip was deployed into the biopsy cavity. Site 2: 11 o'clock 1 centimeter from the nipple. Lesion quadrant: UPPER-OUTER QUADRANT, venus clip Using sterile technique and lidocaine and lidocaine with epinephrine as local anesthetic, under direct ultrasound visualization, a 12 gauge spring-loaded device was used to perform biopsy of mass in the 11 o'clock location of the RIGHT breast 1 centimeter from the nipple using a inferior to superior approach. At the conclusion of the procedure venus tissue marker clip was deployed into the biopsy cavity. Follow-up 2-view mammogram was performed and dictated separately. IMPRESSION: Ultrasound guided biopsy of 2 masses in the RIGHT breast. Of note 2 additional masses are also identified but not biopsied today. No apparent complications. Electronically Signed: By: Nolon Nations M.D. On: 03/24/2022 09:54   MM CLIP PLACEMENT RIGHT  Result Date: 03/24/2022 CLINICAL DATA:  Status post ultrasound-guided core biopsy of 2  masses in the RIGHT breast. EXAM: 3D DIAGNOSTIC RIGHT MAMMOGRAM POST ULTRASOUND BIOPSY x2 COMPARISON:  Previous exam(s). FINDINGS: 3D Mammographic images were obtained following ultrasound guided biopsy of mass in the 10 o'clock location of the RIGHT breast 4 centimeters from the nipple and placement of a heart shaped clip. The biopsy marking clip is in expected position at the site of biopsy. Following biopsy of  mass in the 11 o'clock location 1 centimeter from the nipple, a Venus shaped clip was placed and is identified in the expected location. The clips are 3 centimeters apart on the craniocaudal projection. IMPRESSION: Tissue marker clips are in the expected locations after biopsy. Final Assessment: Post Procedure Mammograms for Marker Placement Electronically Signed   By: Nolon Nations M.D.   On: 03/24/2022 10:22      Assessment & Plan:  Hypercholesteremia Assessment & Plan: Low cholesterol diet and exercise.  Follow lipid panel.    Weight gain Assessment & Plan: Concerned regarding weight and desires weight loss.  Is exercising.  Watching her diet.  Trial of metformin.  Hard to remember to take.  Wanted to try wegovy.  Discussed the medication and possible side effects of the medication (GLP 1 agonist). Prescribed wegovy .'25mg'$ . Unable to get.  Trial of zepbound.  Continue diet and exercise. Follow.    Stress Assessment & Plan: Increased stress as outlined.  Discussed.  Feels needs something to help level things out.  Start zoloft as directed.  Follow.     Other orders -     Sertraline HCl; Take 1 tablet (25 mg total) by mouth daily.  Dispense: 30 tablet; Refill: 2 -     Zepbound; Inject 2.5 mg into the skin once a week.  Dispense: 2 mL; Refill: 2     Einar Pheasant, MD

## 2022-04-15 ENCOUNTER — Ambulatory Visit: Payer: Self-pay | Admitting: General Surgery

## 2022-04-15 DIAGNOSIS — D241 Benign neoplasm of right breast: Secondary | ICD-10-CM

## 2022-04-15 MED ORDER — KETOROLAC TROMETHAMINE 15 MG/ML IJ SOLN: 15.0000 mg | Freq: Once | INTRAMUSCULAR | Status: AC

## 2022-04-18 ENCOUNTER — Encounter: Payer: Self-pay | Admitting: Internal Medicine

## 2022-04-19 ENCOUNTER — Encounter: Payer: Self-pay | Admitting: Internal Medicine

## 2022-04-19 DIAGNOSIS — F439 Reaction to severe stress, unspecified: Secondary | ICD-10-CM | POA: Insufficient documentation

## 2022-04-19 NOTE — Assessment & Plan Note (Signed)
Concerned regarding weight and desires weight loss.  Is exercising.  Watching her diet.  Trial of metformin.  Hard to remember to take.  Wanted to try wegovy.  Discussed the medication and possible side effects of the medication (GLP 1 agonist). Prescribed wegovy .'25mg'$ . Unable to get.  Trial of zepbound.  Continue diet and exercise. Follow.

## 2022-04-19 NOTE — Assessment & Plan Note (Signed)
Low cholesterol diet and exercise.  Follow lipid panel.   

## 2022-04-19 NOTE — Assessment & Plan Note (Signed)
Increased stress as outlined.  Discussed.  Feels needs something to help level things out.  Start zoloft as directed.  Follow.

## 2022-04-20 ENCOUNTER — Telehealth: Payer: Self-pay

## 2022-04-20 ENCOUNTER — Other Ambulatory Visit (HOSPITAL_COMMUNITY): Payer: Self-pay

## 2022-04-20 NOTE — Telephone Encounter (Signed)
PA Zepbound 

## 2022-04-20 NOTE — Telephone Encounter (Signed)
Pharmacy Patient Advocate Encounter   Received notification that prior authorization for Zepbound 2.'5MG'$ /0.5ML pen-injectors is required/requested.  Per Test Claim: PA required   PA submitted on 04/20/22 to (ins) [employer] via [employer] or [facility]) confirmation # [id number]  Status is pending

## 2022-04-21 ENCOUNTER — Other Ambulatory Visit (HOSPITAL_COMMUNITY): Payer: Self-pay

## 2022-04-21 ENCOUNTER — Other Ambulatory Visit: Payer: Self-pay | Admitting: General Surgery

## 2022-04-21 DIAGNOSIS — D241 Benign neoplasm of right breast: Secondary | ICD-10-CM

## 2022-04-21 NOTE — Telephone Encounter (Signed)
Pharmacy Patient Advocate Encounter  Received notification from [employer] that the request for prior authorization for Zepbound 2.'5MG'$ /0.5ML pen-injectors has been Closed due to No PA required.     Called pharmacy, prescription did go through insurance. Patient has a high deductible so her copay is over $1000

## 2022-05-08 ENCOUNTER — Other Ambulatory Visit: Payer: Self-pay | Admitting: Internal Medicine

## 2022-06-01 ENCOUNTER — Other Ambulatory Visit: Payer: Self-pay

## 2022-06-01 ENCOUNTER — Encounter (HOSPITAL_BASED_OUTPATIENT_CLINIC_OR_DEPARTMENT_OTHER): Payer: Self-pay | Admitting: General Surgery

## 2022-06-01 NOTE — Progress Notes (Signed)

## 2022-06-01 NOTE — Progress Notes (Signed)
   06/01/22 1235  PAT Phone Screen  Is the patient taking a GLP-1 receptor agonist? (S)  No (has rx for zepbound but not taking for over a month)  Do You Have Diabetes? No  Do You Have Hypertension? No  Have You Ever Been to the ER for Asthma? No  Have You Taken Oral Steroids in the Past 3 Months? No  Do you Take Phenteramine or any Other Diet Drugs? No  Recent  Lab Work, EKG, CXR? No  Do you have a history of heart problems? No  Any Recent Hospitalizations? No  Height 5' (1.524 m)  Weight 76 kg  Pat Appointment Scheduled No  Reason for No Appointment Not Needed

## 2022-06-04 ENCOUNTER — Ambulatory Visit
Admission: RE | Admit: 2022-06-04 | Discharge: 2022-06-04 | Disposition: A | Discharge status: Home / Self Care | Payer: BC Managed Care – PPO | Source: Ambulatory Visit | Attending: General Surgery | Admitting: General Surgery

## 2022-06-04 ENCOUNTER — Other Ambulatory Visit: Payer: Self-pay | Admitting: General Surgery

## 2022-06-04 DIAGNOSIS — D241 Benign neoplasm of right breast: Secondary | ICD-10-CM

## 2022-06-04 HISTORY — PX: BREAST BIOPSY: SHX20

## 2022-06-05 ENCOUNTER — Ambulatory Visit (HOSPITAL_BASED_OUTPATIENT_CLINIC_OR_DEPARTMENT_OTHER)
Admission: RE | Admit: 2022-06-05 | Discharge: 2022-06-05 | Disposition: A | Discharge status: Home / Self Care | Payer: BC Managed Care – PPO | Source: Ambulatory Visit | Attending: General Surgery | Admitting: General Surgery

## 2022-06-05 ENCOUNTER — Other Ambulatory Visit: Payer: Self-pay

## 2022-06-05 ENCOUNTER — Ambulatory Visit
Admission: RE | Admit: 2022-06-05 | Discharge: 2022-06-05 | Disposition: A | Discharge status: Home / Self Care | Payer: BC Managed Care – PPO | Source: Ambulatory Visit | Attending: General Surgery | Admitting: General Surgery

## 2022-06-05 ENCOUNTER — Ambulatory Visit (HOSPITAL_BASED_OUTPATIENT_CLINIC_OR_DEPARTMENT_OTHER): Payer: BC Managed Care – PPO | Admitting: Certified Registered"

## 2022-06-05 ENCOUNTER — Encounter (HOSPITAL_BASED_OUTPATIENT_CLINIC_OR_DEPARTMENT_OTHER): Payer: Self-pay | Admitting: General Surgery

## 2022-06-05 ENCOUNTER — Encounter (HOSPITAL_BASED_OUTPATIENT_CLINIC_OR_DEPARTMENT_OTHER)
Admission: RE | Disposition: A | Discharge status: Home / Self Care | Payer: Self-pay | Source: Ambulatory Visit | Attending: General Surgery

## 2022-06-05 DIAGNOSIS — D241 Benign neoplasm of right breast: Secondary | ICD-10-CM | POA: Insufficient documentation

## 2022-06-05 DIAGNOSIS — N6031 Fibrosclerosis of right breast: Secondary | ICD-10-CM | POA: Diagnosis not present

## 2022-06-05 DIAGNOSIS — N6489 Other specified disorders of breast: Secondary | ICD-10-CM | POA: Insufficient documentation

## 2022-06-05 DIAGNOSIS — Z01818 Encounter for other preprocedural examination: Secondary | ICD-10-CM

## 2022-06-05 DIAGNOSIS — N6021 Fibroadenosis of right breast: Secondary | ICD-10-CM | POA: Insufficient documentation

## 2022-06-05 HISTORY — PX: BREAST LUMPECTOMY WITH RADIOACTIVE SEED LOCALIZATION: SHX6424

## 2022-06-05 LAB — POCT PREGNANCY, URINE: Preg Test, Ur: NEGATIVE

## 2022-06-05 SURGERY — BREAST LUMPECTOMY WITH RADIOACTIVE SEED LOCALIZATION
Anesthesia: General | Site: Breast | Laterality: Right

## 2022-06-05 MED ORDER — SCOPOLAMINE 1 MG/3DAYS TD PT72
MEDICATED_PATCH | TRANSDERMAL | Status: AC
  Filled 2022-06-05: qty 1

## 2022-06-05 MED ORDER — LIDOCAINE HCL (CARDIAC) PF 100 MG/5ML IV SOSY
PREFILLED_SYRINGE | INTRAVENOUS | Status: DC | PRN
  Administered 2022-06-05: 100 mg via INTRAVENOUS

## 2022-06-05 MED ORDER — PROMETHAZINE HCL 25 MG/ML IJ SOLN: 6.2500 mg | INTRAMUSCULAR | Status: DC | PRN

## 2022-06-05 MED ORDER — ACETAMINOPHEN 500 MG PO TABS
1000.0000 mg | ORAL_TABLET | ORAL | Status: AC
  Administered 2022-06-05: 1000 mg via ORAL

## 2022-06-05 MED ORDER — FENTANYL CITRATE (PF) 100 MCG/2ML IJ SOLN
INTRAMUSCULAR | Status: AC
  Filled 2022-06-05: qty 2

## 2022-06-05 MED ORDER — MIDAZOLAM HCL 2 MG/2ML IJ SOLN
INTRAMUSCULAR | Status: DC | PRN
  Administered 2022-06-05: 2 mg via INTRAVENOUS

## 2022-06-05 MED ORDER — LACTATED RINGERS IV SOLN: INTRAVENOUS | Status: DC

## 2022-06-05 MED ORDER — ONDANSETRON HCL 4 MG/2ML IJ SOLN
INTRAMUSCULAR | Status: DC | PRN
  Administered 2022-06-05: 4 mg via INTRAVENOUS

## 2022-06-05 MED ORDER — FENTANYL CITRATE (PF) 100 MCG/2ML IJ SOLN: 25.0000 ug | INTRAMUSCULAR | Status: DC | PRN

## 2022-06-05 MED ORDER — VANCOMYCIN HCL IN DEXTROSE 1-5 GM/200ML-% IV SOLN
INTRAVENOUS | Status: AC
  Filled 2022-06-05: qty 200

## 2022-06-05 MED ORDER — GABAPENTIN 300 MG PO CAPS
ORAL_CAPSULE | ORAL | Status: AC
  Filled 2022-06-05: qty 1

## 2022-06-05 MED ORDER — DEXAMETHASONE SODIUM PHOSPHATE 10 MG/ML IJ SOLN
INTRAMUSCULAR | Status: AC
  Filled 2022-06-05: qty 1

## 2022-06-05 MED ORDER — ACETAMINOPHEN 500 MG PO TABS: 1000.0000 mg | ORAL_TABLET | Freq: Once | ORAL | Status: AC

## 2022-06-05 MED ORDER — ACETAMINOPHEN 500 MG PO TABS
ORAL_TABLET | ORAL | Status: AC
  Filled 2022-06-05: qty 2

## 2022-06-05 MED ORDER — FENTANYL CITRATE (PF) 100 MCG/2ML IJ SOLN
INTRAMUSCULAR | Status: DC | PRN
  Administered 2022-06-05: 50 ug via INTRAVENOUS
  Administered 2022-06-05 (×6): 25 ug via INTRAVENOUS

## 2022-06-05 MED ORDER — MIDAZOLAM HCL 2 MG/2ML IJ SOLN
INTRAMUSCULAR | Status: AC
  Filled 2022-06-05: qty 2

## 2022-06-05 MED ORDER — BUPIVACAINE-EPINEPHRINE (PF) 0.25% -1:200000 IJ SOLN
INTRAMUSCULAR | Status: DC | PRN
  Administered 2022-06-05: 20 mL

## 2022-06-05 MED ORDER — PROPOFOL 500 MG/50ML IV EMUL
INTRAVENOUS | Status: DC | PRN
  Administered 2022-06-05: 200 ug/kg/min via INTRAVENOUS

## 2022-06-05 MED ORDER — CHLORHEXIDINE GLUCONATE CLOTH 2 % EX PADS: 6.0000 | MEDICATED_PAD | Freq: Once | CUTANEOUS | Status: DC

## 2022-06-05 MED ORDER — AMISULPRIDE (ANTIEMETIC) 5 MG/2ML IV SOLN: 10.0000 mg | Freq: Once | INTRAVENOUS | Status: DC | PRN

## 2022-06-05 MED ORDER — PROPOFOL 10 MG/ML IV BOLUS
INTRAVENOUS | Status: DC | PRN
  Administered 2022-06-05: 150 mg via INTRAVENOUS

## 2022-06-05 MED ORDER — SCOPOLAMINE 1 MG/3DAYS TD PT72
1.0000 | MEDICATED_PATCH | TRANSDERMAL | Status: DC
  Administered 2022-06-05: 1.5 mg via TRANSDERMAL

## 2022-06-05 MED ORDER — ONDANSETRON HCL 4 MG/2ML IJ SOLN
INTRAMUSCULAR | Status: AC
  Filled 2022-06-05: qty 2

## 2022-06-05 MED ORDER — GABAPENTIN 300 MG PO CAPS
300.0000 mg | ORAL_CAPSULE | ORAL | Status: AC
  Administered 2022-06-05: 300 mg via ORAL

## 2022-06-05 MED ORDER — LACTATED RINGERS IV SOLN: INTRAVENOUS | Status: DC | PRN

## 2022-06-05 MED ORDER — DEXAMETHASONE SODIUM PHOSPHATE 10 MG/ML IJ SOLN
INTRAMUSCULAR | Status: DC | PRN
  Administered 2022-06-05: 5 mg via INTRAVENOUS

## 2022-06-05 MED ORDER — OXYCODONE HCL 5 MG PO TABS
5.0000 mg | ORAL_TABLET | Freq: Four times a day (QID) | ORAL | 0 refills | Status: DC | PRN
Start: 2022-06-05 — End: 2022-06-11

## 2022-06-05 MED ORDER — PROPOFOL 10 MG/ML IV BOLUS
INTRAVENOUS | Status: AC
  Filled 2022-06-05: qty 20

## 2022-06-05 MED ORDER — VANCOMYCIN HCL IN DEXTROSE 1-5 GM/200ML-% IV SOLN
1000.0000 mg | INTRAVENOUS | Status: AC
  Administered 2022-06-05: 1000 mg via INTRAVENOUS

## 2022-06-05 SURGICAL SUPPLY — 39 items
ADH SKN CLS APL DERMABOND .7 (GAUZE/BANDAGES/DRESSINGS) ×1
APL PRP STRL LF DISP 70% ISPRP (MISCELLANEOUS) ×1
APPLIER CLIP 9.375 MED OPEN (MISCELLANEOUS)
APR CLP MED 9.3 20 MLT OPN (MISCELLANEOUS)
BLADE SURG 15 STRL LF DISP TIS (BLADE) ×1 IMPLANT
BLADE SURG 15 STRL SS (BLADE) ×1
CANISTER SUC SOCK COL 7IN (MISCELLANEOUS) ×1 IMPLANT
CANISTER SUCT 1200ML W/VALVE (MISCELLANEOUS) ×1 IMPLANT
CHLORAPREP W/TINT 26 (MISCELLANEOUS) ×1 IMPLANT
CLIP APPLIE 9.375 MED OPEN (MISCELLANEOUS) IMPLANT
COVER BACK TABLE 60X90IN (DRAPES) ×1 IMPLANT
COVER MAYO STAND STRL (DRAPES) ×1 IMPLANT
COVER PROBE CYLINDRICAL 5X96 (MISCELLANEOUS) ×1 IMPLANT
DERMABOND ADVANCED .7 DNX12 (GAUZE/BANDAGES/DRESSINGS) ×1 IMPLANT
DRAPE LAPAROSCOPIC ABDOMINAL (DRAPES) ×1 IMPLANT
DRAPE UTILITY XL STRL (DRAPES) ×1 IMPLANT
ELECT COATED BLADE 2.86 ST (ELECTRODE) ×1 IMPLANT
ELECT REM PT RETURN 9FT ADLT (ELECTROSURGICAL) ×1
ELECTRODE REM PT RTRN 9FT ADLT (ELECTROSURGICAL) ×1 IMPLANT
GLOVE BIO SURGEON STRL SZ7.5 (GLOVE) ×2 IMPLANT
GOWN STRL REUS W/ TWL LRG LVL3 (GOWN DISPOSABLE) ×2 IMPLANT
GOWN STRL REUS W/TWL LRG LVL3 (GOWN DISPOSABLE) ×2
KIT MARKER MARGIN INK (KITS) ×1 IMPLANT
NDL HYPO 25X1 1.5 SAFETY (NEEDLE) IMPLANT
NEEDLE HYPO 25X1 1.5 SAFETY (NEEDLE) ×1 IMPLANT
NS IRRIG 1000ML POUR BTL (IV SOLUTION) IMPLANT
PACK BASIN DAY SURGERY FS (CUSTOM PROCEDURE TRAY) ×1 IMPLANT
PENCIL SMOKE EVACUATOR (MISCELLANEOUS) ×1 IMPLANT
SLEEVE SCD COMPRESS KNEE MED (STOCKING) ×1 IMPLANT
SPIKE FLUID TRANSFER (MISCELLANEOUS) IMPLANT
SPONGE T-LAP 18X18 ~~LOC~~+RFID (SPONGE) ×1 IMPLANT
SUT MON AB 4-0 PC3 18 (SUTURE) ×1 IMPLANT
SUT SILK 2 0 SH (SUTURE) IMPLANT
SUT VICRYL 3-0 CR8 SH (SUTURE) ×1 IMPLANT
SYR CONTROL 10ML LL (SYRINGE) IMPLANT
TOWEL GREEN STERILE FF (TOWEL DISPOSABLE) ×1 IMPLANT
TRAY FAXITRON CT DISP (TRAY / TRAY PROCEDURE) ×1 IMPLANT
TUBE CONNECTING 20X1/4 (TUBING) ×1 IMPLANT
YANKAUER SUCT BULB TIP NO VENT (SUCTIONS) IMPLANT

## 2022-06-05 NOTE — Anesthesia Procedure Notes (Signed)
Procedure Name: LMA Insertion Date/Time: 06/05/2022 9:12 AM  Performed by: Karen Kitchens, CRNAPre-anesthesia Checklist: Patient identified, Emergency Drugs available, Suction available and Patient being monitored Patient Re-evaluated:Patient Re-evaluated prior to induction Oxygen Delivery Method: Circle system utilized Preoxygenation: Pre-oxygenation with 100% oxygen Induction Type: IV induction Ventilation: Mask ventilation without difficulty LMA: LMA inserted LMA Size: 3.0 Number of attempts: 1 Airway Equipment and Method: Bite block Placement Confirmation: positive ETCO2 Tube secured with: Tape Dental Injury: Teeth and Oropharynx as per pre-operative assessment

## 2022-06-05 NOTE — Transfer of Care (Signed)
Immediate Anesthesia Transfer of Care Note  Patient: Holly Oliver  Procedure(s) Performed: RIGHT BREAST LUMPECTOMY WITH RADIOACTIVE SEED LOCALIZATION X2 (Right: Breast)  Patient Location: PACU  Anesthesia Type:General  Level of Consciousness: awake, alert , and oriented  Airway & Oxygen Therapy: Patient Spontanous Breathing and Patient connected to face mask oxygen  Post-op Assessment: Report given to RN and Post -op Vital signs reviewed and stable  Post vital signs: Reviewed and stable  Last Vitals:  Vitals Value Taken Time  BP    Temp    Pulse 83 06/05/22 0956  Resp 13 06/05/22 0956  SpO2 96 % 06/05/22 0956  Vitals shown include unvalidated device data.  Last Pain:  Vitals:   06/05/22 0829  TempSrc: Oral  PainSc: 0-No pain      Patients Stated Pain Goal: 7 (06/05/22 0829)  Complications: No notable events documented.

## 2022-06-05 NOTE — Interval H&P Note (Signed)
History and Physical Interval Note:  06/05/2022 8:51 AM  Holly Oliver  has presented today for surgery, with the diagnosis of RIGHT BREAST PAPILLOMA AND FIBROEPITHELIAL LESION.  The various methods of treatment have been discussed with the patient and family. After consideration of risks, benefits and other options for treatment, the patient has consented to  Procedure(s): RIGHT BREAST LUMPECTOMY WITH RADIOACTIVE SEED LOCALIZATION X2 (Right) as a surgical intervention.  [name] history has been reviewed, patient examined, no change in status, stable for surgery.  I have reviewed the patient's chart and labs.  Questions were answered to the patient's satisfaction.     Chevis Pretty III

## 2022-06-05 NOTE — Anesthesia Preprocedure Evaluation (Addendum)
Anesthesia Evaluation  Patient identified by MRN, date of birth, ID band Patient awake    Reviewed: Allergy & Precautions, NPO status , Patient's Chart, lab work & pertinent test results  History of Anesthesia Complications Negative for: history of anesthetic complications  Airway Mallampati: II  TM Distance: >3 FB Neck ROM: Full    Dental no notable dental hx. (+) Dental Advisory Given   Pulmonary neg pulmonary ROS   Pulmonary exam normal        Cardiovascular negative cardio ROS Normal cardiovascular exam     Neuro/Psych  PSYCHIATRIC DISORDERS  Depression    negative neurological ROS     GI/Hepatic negative GI ROS, Neg liver ROS,,,  Endo/Other  negative endocrine ROS    Renal/GU negative Renal ROS     Musculoskeletal negative musculoskeletal ROS (+)    Abdominal   Peds  Hematology negative hematology ROS (+)   Anesthesia Other Findings   Reproductive/Obstetrics                              Anesthesia Physical Anesthesia Plan  ASA: 2  Anesthesia Plan: General   Post-op Pain Management: Tylenol PO (pre-op)* and Toradol IV (intra-op)*   Induction: Intravenous  PONV Risk Score and Plan: 4 or greater and Ondansetron, Dexamethasone, Midazolam and Propofol infusion  Airway Management Planned: LMA  Additional Equipment:   Intra-op Plan:   Post-operative Plan: Extubation in OR  Informed Consent: I have reviewed the patients History and Physical, chart, labs and discussed the procedure including the risks, benefits and alternatives for the proposed anesthesia with the patient or authorized representative who has indicated his/her understanding and acceptance.     Dental advisory given  Plan Discussed with: Anesthesiologist and CRNA  Anesthesia Plan Comments:         Anesthesia Quick Evaluation

## 2022-06-05 NOTE — Anesthesia Postprocedure Evaluation (Signed)
Anesthesia Post Note  Patient: Holly Oliver  Procedure(s) Performed: RIGHT BREAST LUMPECTOMY WITH RADIOACTIVE SEED LOCALIZATION X2 (Right: Breast)     Patient location during evaluation: PACU Anesthesia Type: General Level of consciousness: sedated Pain management: pain level controlled Vital Signs Assessment: post-procedure vital signs reviewed and stable Respiratory status: spontaneous breathing and respiratory function stable Cardiovascular status: stable Postop Assessment: no apparent nausea or vomiting Anesthetic complications: no   No notable events documented.  Last Vitals:  Vitals:   06/05/22 1045 06/05/22 1115  BP: 113/79 122/75  Pulse: 74 81  Resp: (!) 21 18  Temp:  36.4 C  SpO2: 93% 95%    Last Pain:  Vitals:   06/05/22 1115  TempSrc:   PainSc: 0-No pain                 Tiffane Sheldon DANIEL

## 2022-06-05 NOTE — H&P (Signed)
REFERRING PHYSICIAN: Charm Barges, MD PROVIDER: Lindell Noe, MD MRN: [id number] DOB: 1980-08-23 Subjective   Chief Complaint: New Consultation (Abnormal mammogram)  History of Present Illness: Holly Oliver is a 42 y.o. female who is seen today as an office consultation for evaluation of New Consultation (Abnormal mammogram)  We are asked to see the patient in consultation by Dr. Dale Colfax to evaluate her for a right breast mass. The patient is a 42 year old white female who recently went for a first routine screening mammogram. At that time she was initially thought to have a distortion in the right breast. On further evaluation with ultrasound the distortion disappeared but she was found to have 2 small masses in the upper outer central right breast measuring 1 cm and 6 mm. Both were biopsied and 1 came back as an intraductal papilloma and the other as a fibroepithelial lesion with sclerosis. She is otherwise in good health and does not smoke. She does have a family history of breast cancer in great aunt.  Review of Systems: A complete review of systems was obtained from the patient. I have reviewed this information and discussed as appropriate with the patient. See HPI as well for other ROS.  ROS   Medical History: History reviewed. No pertinent past medical history.  Patient Active Problem List  Diagnosis  Intraductal papilloma of right breast   Past Surgical History:  Procedure Laterality Date  CESAREAN SECTION    Allergies  Allergen Reactions  Cephalexin Unknown  Erythromycin Unknown   Current Outpatient Medications on File Prior to Visit  Medication Sig Dispense Refill  ACETAMINOPHEN (TYLENOL ORAL) Take by mouth. Reported on 06/11/2015  albuterol 90 mcg/actuation inhaler Inhale 2 inhalations into the lungs every 6 (six) hours as needed for Wheezing. 1 Inhaler 1  hydrocodone-chlorpheniramine (TUSSIONEX) 10-8 mg/5 mL ER suspension Take 5 mLs by mouth every  12 (twelve) hours as needed for Cough. 120 mL 0  hydrocodone-homatropine (HYCODAN) 5-1.5 mg/5 mL syrup Take 5 mLs by mouth every 6 (six) hours as needed for Cough. 120 mL 0  triamcinolone 0.1 % cream Apply topically 2 (two) times daily. 15 g 0   No current facility-administered medications on file prior to visit.   Family History  Problem Relation Age of Onset  Obesity Mother  High blood pressure (Hypertension) Mother  Hyperlipidemia (Elevated cholesterol) Mother  Hyperlipidemia (Elevated cholesterol) Father  High blood pressure (Hypertension) Father    Social History   Tobacco Use  Smoking Status Never  Smokeless Tobacco Never    Social History   Socioeconomic History  Marital status: Married  Tobacco Use  Smoking status: Never  Smokeless tobacco: Never  Substance and Sexual Activity  Alcohol use: Not Currently  Drug use: No   Objective:   Vitals:  BP: 118/64  Pulse: 85  Temp: 36.8 C (98.3 F)  SpO2: 98%  Weight: 77.3 kg (170 lb 6.4 oz)  Height: 152.4 cm (5')  PainSc: 0-No pain   Body mass index is 33.28 kg/m.  Physical Exam Vitals reviewed.  Constitutional:  General: She is not in acute distress. Appearance: Normal appearance.  HENT:  Head: Normocephalic and atraumatic.  Right Ear: External ear normal.  Left Ear: External ear normal.  Nose: Nose normal.  Mouth/Throat:  Mouth: Mucous membranes are moist.  Pharynx: Oropharynx is clear.  Eyes:  General: No scleral icterus. Extraocular Movements: Extraocular movements intact.  Conjunctiva/sclera: Conjunctivae normal.  Pupils: Pupils are equal, round, and reactive to light.  Cardiovascular:  Rate and Rhythm: Normal rate and regular rhythm.  Pulses: Normal pulses.  Heart sounds: Normal heart sounds.  Pulmonary:  Effort: Pulmonary effort is normal. No respiratory distress.  Breath sounds: Normal breath sounds.  Abdominal:  General: Bowel sounds are normal.  Palpations: Abdomen is soft.   Tenderness: There is no abdominal tenderness.  Musculoskeletal:  General: No swelling, tenderness or deformity. Normal range of motion.  Cervical back: Normal range of motion and neck supple.  Skin: General: Skin is warm and dry.  Coloration: Skin is not jaundiced.  Neurological:  General: No focal deficit present.  Mental Status: She is alert and oriented to person, place, and time.  Psychiatric:  Mood and Affect: Mood normal.  Behavior: Behavior normal.     Breast: There is no palpable mass in either breast. There is no palpable axillary, supraclavicular, or cervical lymphadenopathy.  Labs, Imaging and Diagnostic Testing:  Assessment and Plan:   Diagnoses and all orders for this visit:  Intraductal papilloma of right breast - [facility] Case Posting Request; Future   The patient appears to have an intraductal papilloma and sclerosing fibroepithelial lesion in the upper outer central right breast. Because of the appearance and size of the areas involved there is about a 5 to 10% chance of missing something more significant and the recommendation is to have these areas both removed. She would also like to have this done. I have discussed with her in detail the risks and benefits of the operation as well as some of the technical aspects including the use of radioactive seeds for localization and she understands and wishes to proceed.

## 2022-06-05 NOTE — Discharge Instructions (Signed)
  Post Anesthesia Home Care Instructions  Activity: Get plenty of rest for the remainder of the day. A responsible individual must stay with you for 24 hours following the procedure.  For the next 24 hours, DO NOT: -Drive a car -Advertising copywriter -Drink alcoholic beverages -Take any medication unless instructed by your physician -Make any legal decisions or sign important papers.  Meals: Start with liquid foods such as gelatin or soup. Progress to regular foods as tolerated. Avoid greasy, spicy, heavy foods. If nausea and/or vomiting occur, drink only clear liquids until the nausea and/or vomiting subsides. Call your physician if vomiting continues.  Special Instructions/Symptoms: Your throat may feel dry or sore from the anesthesia or the breathing tube placed in your throat during surgery. If this causes discomfort, gargle with warm salt water. The discomfort should disappear within 24 hours.  If you had a scopolamine patch placed behind your ear for the management of post- operative nausea and/or vomiting:  1. The medication in the patch is effective for 72 hours, after which it should be removed.  Wrap patch in a tissue and discard in the trash. Wash hands thoroughly with soap and water. 2. You may remove the patch earlier than 72 hours if you experience unpleasant side effects which may include dry mouth, dizziness or visual disturbances. 3. Avoid touching the patch. Wash your hands with soap and water after contact with the patch.      Next dose of Tylenol and Ibuprofen may be taken at 2:30p

## 2022-06-05 NOTE — Op Note (Signed)
06/05/2022  9:53 AM  PATIENT:  Holly Oliver  42 y.o. female  PRE-OPERATIVE DIAGNOSIS:  RIGHT BREAST PAPILLOMA AND FIBROEPITHELIAL LESION  POST-OPERATIVE DIAGNOSIS:  RIGHT BREAST PAPILLOMA AND FIBROEPITHELIAL LESION  PROCEDURE:  Procedure(s): RIGHT BREAST LUMPECTOMY WITH RADIOACTIVE SEED LOCALIZATION X2 (Right)  SURGEON:  Surgeon(s) and Role:    * Griselda Miner, MD - Primary  PHYSICIAN ASSISTANT:   ASSISTANTS: none   ANESTHESIA:   local and general  EBL:  20 mL   BLOOD ADMINISTERED:none  DRAINS: none   LOCAL MEDICATIONS USED:  MARCAINE     SPECIMEN:  Source of Specimen:  right breast tissue  DISPOSITION OF SPECIMEN:  PATHOLOGY  COUNTS:  YES  TOURNIQUET:  * No tourniquets in log *  DICTATION: .Dragon Dictation  After informed consent was obtained the patient was brought to the operating room and placed in the supine position on the operating table.  After adequate induction of general anesthesia the patient's right breast was prepped with ChloraPrep, allowed to dry, and draped in usual sterile manner.  An appropriate timeout was performed.  Previously 4 I-125 seeds were placed in the outer central right breast to mark areas of intraductal papilloma and fibroepithelial lesion.  All of the seeds and clips were clustered in 1 small area.  The neoprobe was set to I-125 in the area of radioactivity was readily identified.  The area around this was infiltrated with quarter percent Marcaine.  A curvilinear incision was made along the outer edge of the areola of the right breast with a 15 blade knife.  The incision was carried through the skin and subcutaneous tissue sharply with the electrocautery.  Dissection was then carried around the 4 radioactive seeds while checking the area of radioactivity frequently.  Once the specimen was removed it was oriented with the appropriate paint colors.  A specimen radiograph was obtained that showed all 4 seeds and both clips to be within the  specimen.  The specimen was then sent to pathology for further evaluation.  Hemostasis was achieved using the Bovie electrocautery.  The wound was infiltrated with more quarter percent Marcaine and irrigated with saline.  The deep layer of the wound was then closed with layers of interrupted 3-0 Vicryl stitches.  The skin was then closed with interrupted 4-0 Monocryl subcuticular stitches.  Dermabond dressings were applied.  The patient tolerated the procedure well.  At the end of the case all needle sponge and instrument counts were correct.  The patient was then awakened and taken to recovery in stable condition.  PLAN OF CARE: Discharge to home after PACU  PATIENT DISPOSITION:  PACU - hemodynamically stable.   Delay start of Pharmacological VTE agent (>24hrs) due to surgical blood loss or risk of bleeding: not applicable

## 2022-06-08 ENCOUNTER — Encounter (HOSPITAL_BASED_OUTPATIENT_CLINIC_OR_DEPARTMENT_OTHER): Payer: Self-pay | Admitting: General Surgery

## 2022-06-08 LAB — SURGICAL PATHOLOGY

## 2022-06-10 ENCOUNTER — Ambulatory Visit: Payer: BC Managed Care – PPO | Admitting: Internal Medicine

## 2022-06-11 ENCOUNTER — Ambulatory Visit: Payer: BC Managed Care – PPO | Admitting: Internal Medicine

## 2022-06-11 ENCOUNTER — Encounter: Payer: Self-pay | Admitting: Internal Medicine

## 2022-06-11 VITALS — BP 114/68 | HR 86 | Temp 98.3°F | Resp 16 | Ht 60.0 in | Wt 156.8 lb

## 2022-06-11 DIAGNOSIS — F439 Reaction to severe stress, unspecified: Secondary | ICD-10-CM

## 2022-06-11 DIAGNOSIS — Z713 Dietary counseling and surveillance: Secondary | ICD-10-CM

## 2022-06-11 DIAGNOSIS — E7800 Pure hypercholesterolemia, unspecified: Secondary | ICD-10-CM

## 2022-06-11 LAB — HEPATIC FUNCTION PANEL
ALT: 13 U/L (ref 0–35)
AST: 15 U/L (ref 0–37)
Albumin: 4 g/dL (ref 3.5–5.2)
Alkaline Phosphatase: 61 U/L (ref 39–117)
Bilirubin, Direct: 0.1 mg/dL (ref 0.0–0.3)
Total Bilirubin: 0.5 mg/dL (ref 0.2–1.2)
Total Protein: 6.6 g/dL (ref 6.0–8.3)

## 2022-06-11 LAB — CBC WITH DIFFERENTIAL/PLATELET
Basophils Absolute: 0 10*3/uL (ref 0.0–0.1)
Basophils Relative: 0.4 % (ref 0.0–3.0)
Eosinophils Absolute: 0.2 10*3/uL (ref 0.0–0.7)
Eosinophils Relative: 2.4 % (ref 0.0–5.0)
HCT: 44.1 % (ref 36.0–46.0)
Hemoglobin: 14.6 g/dL (ref 12.0–15.0)
Lymphocytes Relative: 29.8 % (ref 12.0–46.0)
Lymphs Abs: 2.3 10*3/uL (ref 0.7–4.0)
MCHC: 33 g/dL (ref 30.0–36.0)
MCV: 88.2 fl (ref 78.0–100.0)
Monocytes Absolute: 0.5 10*3/uL (ref 0.1–1.0)
Monocytes Relative: 7 % (ref 3.0–12.0)
Neutro Abs: 4.6 10*3/uL (ref 1.4–7.7)
Neutrophils Relative %: 60.4 % (ref 43.0–77.0)
Platelets: 235 10*3/uL (ref 150.0–400.0)
RBC: 5 Mil/uL (ref 3.87–5.11)
RDW: 13.9 % (ref 11.5–15.5)
WBC: 7.6 10*3/uL (ref 4.0–10.5)

## 2022-06-11 LAB — LIPID PANEL
Cholesterol: 191 mg/dL (ref 0–200)
HDL: 41.5 mg/dL (ref >39.00)
LDL Cholesterol: 126 mg/dL — ABNORMAL HIGH (ref 0–99)
NonHDL: 149.31
Total CHOL/HDL Ratio: 5
Triglycerides: 118 mg/dL (ref 0.0–149.0)
VLDL: 23.6 mg/dL (ref 0.0–40.0)

## 2022-06-11 LAB — BASIC METABOLIC PANEL
BUN: 10 mg/dL (ref 6–23)
CO2: 24 mEq/L (ref 19–32)
Calcium: 8.8 mg/dL (ref 8.4–10.5)
Chloride: 106 mEq/L (ref 96–112)
Creatinine, Ser: 1.04 mg/dL (ref 0.40–1.20)
GFR: 67.29 mL/min (ref >60.00)
Glucose, Bld: 83 mg/dL (ref 70–99)
Potassium: 4 mEq/L (ref 3.5–5.1)
Sodium: 139 mEq/L (ref 135–145)

## 2022-06-11 LAB — TSH: TSH: 2.73 u[IU]/mL (ref 0.35–5.50)

## 2022-06-11 MED ORDER — ZEPBOUND 5 MG/0.5ML ~~LOC~~ SOAJ: 5.0000 mg | SUBCUTANEOUS | 2 refills | Status: DC

## 2022-06-11 NOTE — Progress Notes (Signed)
Subjective:    Patient ID: Holly Oliver, female    DOB: January 22, 1981, 42 y.o.   MRN: [id number]  Patient here for scheduled follow up.   HPI Here for a scheduled follow up.  S/p right breast lumpectomy 06/05/22.  Pathology ok.  Doing well. Healing well.  Has f/u with Dr Carolynne Edouard 06/23/22.  Started zoloft last visit.  This is working well for her.  Trial of zepbound. Started 2.5mg  last visit.  Tolerating.  Had some "burping" when initially started.  Decreased carbonated drinks.  Started probiotics.  Burps are better.  Some allergy symptoms - sneezing - xyzal helping.  No chest pain or sob reported.  No abdominal pain or bowel change reported.  Has lost weight.  Would like to increase zepbound to 5mg  q week.   Past Medical History:  Diagnosis Date   Family history of adverse reaction to anesthesia    Maternal Grandmother -PONV   Heart murmur    followed by PCP   History of chicken pox    Past Surgical History:  Procedure Laterality Date   BREAST BIOPSY Right 03/24/2022   10:00 4cmfn heart clip path pending   BREAST BIOPSY Right 03/24/2022   11:00 1cmfn venus paht pending   BREAST BIOPSY Right 03/24/2022   [place] RT BREAST BX W LOC DEV 1ST LESION IMG BX SPEC US GUIDE 03/24/2022 [facility]   BREAST BIOPSY Right 03/24/2022   [place] RT BREAST BX W LOC DEV EA ADD LESION IMG BX SPEC US GUIDE 03/24/2022 [facility]   BREAST BIOPSY Right 06/04/2022   [place] RT RADIOACTIVE SEED EA ADD LESION 06/04/2022 [facility]   BREAST BIOPSY  06/04/2022   [place] RT RADIOACTIVE SEED LOC 06/04/2022 [facility]   BREAST BIOPSY Right 06/04/2022   [place] RT RADIOACTIVE SEED EA ADD LESION 06/04/2022 [facility]   BREAST BIOPSY Right 06/04/2022   [place] RT RADIOACTIVE SEED EA ADD LESION 06/04/2022 [facility]   BREAST LUMPECTOMY WITH RADIOACTIVE SEED LOCALIZATION Right 06/05/2022   Procedure: RIGHT BREAST LUMPECTOMY WITH RADIOACTIVE SEED LOCALIZATION X2;  Surgeon: Griselda Miner, MD;  Location: [facility];  Service: General;  Laterality: Right;   CESAREAN SECTION  11/02/2009   TONSILLECTOMY N/A 01/29/2017   Procedure: TONSILLECTOMY;  Surgeon: Linus Salmons, MD;  [facility];  Service: ENT;  Laterality: N/A;   Family History  Problem Relation Age of Onset   Hypertension Mother    Hyperlipidemia Mother    Hyperlipidemia Father    Lung cancer Maternal Grandfather    Prostate cancer Maternal Grandfather    Breast cancer Maternal Aunt        great aunt   Diabetes Maternal Aunt    Social History   Socioeconomic History   Marital status: Married    Spouse name: Not on file   Number of children: 1   Years of education: Not on file   Highest education level: Bachelor's degree (e.g., BA, AB, BS)  Occupational History   Occupation: Lobbyist: [employer]  Tobacco Use   Smoking status: Never   Smokeless tobacco: Never  Vaping Use   Vaping Use: Never used  Substance and Sexual Activity   Alcohol use: Yes    Alcohol/week: 0.0 standard drinks of alcohol    Comment: Holidays   Drug use: No   Sexual activity: Not on file  Other Topics Concern   Not on file  Social History Narrative   Not on file  Social Determinants of Health   Financial Resource Strain: Low Risk  (06/07/2022)   Overall Financial Resource Strain (CARDIA)    Difficulty of Paying Living Expenses: Not hard at all  Food Insecurity: No Food Insecurity (06/07/2022)   Hunger Vital Sign    Worried About Running Out of Food in the Last Year: Never true    Ran Out of Food in the Last Year: Never true  Transportation Needs: No Transportation Needs (06/07/2022)   PRAPARE - Administrator, Civil Service (Medical): No    Lack of Transportation (Non-Medical): No  Physical Activity: Insufficiently Active (06/07/2022)   Exercise Vital Sign    Days of Exercise per Week: 3 days    Minutes of Exercise per Session: 30 min  Stress: No Stress Concern Present (06/07/2022)    [employer] of Occupational Health - Occupational Stress Questionnaire    Feeling of Stress : Only a little  Social Connections: Moderately Isolated (06/07/2022)   Social Connection and Isolation Panel [NHANES]    Frequency of Communication with Friends and Family: More than three times a week    Frequency of Social Gatherings with Friends and Family: Once a week    Attends Religious Services: Never    Database administrator or Organizations: No    Attends Engineer, structural: Not on file    Marital Status: Married     Review of Systems  Constitutional:  Negative for appetite change and unexpected weight change.  HENT:  Negative for congestion and sinus pressure.   Respiratory:  Negative for cough, chest tightness and shortness of breath.   Cardiovascular:  Negative for chest pain, palpitations and leg swelling.  Gastrointestinal:  Negative for abdominal pain, diarrhea, nausea and vomiting.  Genitourinary:  Negative for difficulty urinating and dysuria.  Musculoskeletal:  Negative for joint swelling and myalgias.  Skin:  Negative for color change and rash.  Neurological:  Negative for dizziness and headaches.  Psychiatric/Behavioral:  Negative for agitation and dysphoric mood.        Objective:     BP 114/68   Pulse 86   Temp 98.3 F (36.8 C)   Resp 16   Ht 5' (1.524 m)   Wt 156 lb 12.8 oz (71.1 kg)   LMP 05/28/2022 (Exact Date) Comment: UPT neg DOS  SpO2 98%   BMI 30.62 kg/m  Wt Readings from Last 3 Encounters:  06/11/22 156 lb 12.8 oz (71.1 kg)  06/05/22 160 lb 4.4 oz (72.7 kg)  04/14/22 167 lb 12.8 oz (76.1 kg)    Physical Exam Vitals reviewed.  Constitutional:      General: She is not in acute distress.    Appearance: Normal appearance.  HENT:     Head: Normocephalic and atraumatic.     Right Ear: External ear normal.     Left Ear: External ear normal.  Eyes:     General: No scleral icterus.       Right eye: No discharge.        Left eye:  No discharge.     Conjunctiva/sclera: Conjunctivae normal.  Neck:     Thyroid: No thyromegaly.  Cardiovascular:     Rate and Rhythm: Normal rate and regular rhythm.  Pulmonary:     Effort: No respiratory distress.     Breath sounds: Normal breath sounds. No wheezing.     Comments: Breast - right - well healed incision site.   Abdominal:     General: Bowel sounds are  normal.     Palpations: Abdomen is soft.     Tenderness: There is no abdominal tenderness.  Musculoskeletal:        General: No swelling or tenderness.     Cervical back: Neck supple. No tenderness.  Lymphadenopathy:     Cervical: No cervical adenopathy.  Skin:    Findings: No erythema or rash.  Neurological:     Mental Status: She is alert.  Psychiatric:        Mood and Affect: Mood normal.        Behavior: Behavior normal.      Outpatient Encounter Medications as of 06/11/2022  Medication Sig   sertraline (ZOLOFT) 25 MG tablet TAKE 1 TABLET (25 MG TOTAL) BY MOUTH DAILY.   tirzepatide (ZEPBOUND) 5 MG/0.5ML Pen Inject 5 mg into the skin once a week.   [DISCONTINUED] tirzepatide (ZEPBOUND) 2.5 MG/0.5ML Pen Inject 2.5 mg into the skin once a week.   [DISCONTINUED] oxyCODONE (ROXICODONE) 5 MG immediate release tablet Take 1 tablet (5 mg total) by mouth every 6 (six) hours as needed for severe pain.   No facility-administered encounter medications on file as of 06/11/2022.     Lab Results  Component Value Date   WBC 7.6 06/11/2022   HGB 14.6 06/11/2022   HCT 44.1 06/11/2022   PLT 235.0 06/11/2022   GLUCOSE 83 06/11/2022   CHOL 191 06/11/2022   TRIG 118.0 06/11/2022   HDL 41.50 06/11/2022   LDLCALC 126 (H) 06/11/2022   ALT 13 06/11/2022   AST 15 06/11/2022   NA 139 06/11/2022   K 4.0 06/11/2022   CL 106 06/11/2022   CREATININE 1.04 06/11/2022   BUN 10 06/11/2022   CO2 24 06/11/2022   TSH 2.73 06/11/2022   HGBA1C 5.5 07/15/2021    MM Breast Surgical Specimen  Result Date: 06/05/2022 CLINICAL DATA:   Status post surgical excision today. 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 Two additional RIGHT breast masses were not biopsied, but were localized for excision with radioactive seed. In total, 4 radioactive seeds were placed. EXAM: SPECIMEN RADIOGRAPH OF THE RIGHT BREAST COMPARISON:  Previous exam(s). FINDINGS: Status post excision of the right breast. The 4 radioactive seeds and the 2 biopsy marker clips are present and appear completely intact within the specimen. Findings discussed with the OR staff during the procedure. IMPRESSION: Specimen radiograph of the right breast. Electronically Signed   By: Bary Richard M.D.   On: 06/05/2022 09:41      Assessment & Plan:  Hypercholesteremia Assessment & Plan: Low cholesterol diet and exercise.  Has lost weight. Follow lipid panel.   Orders: -     CBC with Differential/Platelet -     Basic metabolic panel -     Hepatic function panel -     TSH -     Lipid panel  Stress Assessment & Plan: Discussed. Doing well on zoloft.  Continue current dose.  Follow.    Weight loss counseling, encounter for Assessment & Plan: Doing well on zepbound.  Cut out carbonated drinks.  Burping resolved.  Has lost weight.  Is watching diet and exercising.  Increase zepbound to 5mg .  Follow.    Other orders -     Zepbound; Inject 5 mg into the skin once a week.  Dispense: 2 mL; Refill: 2     Dale Lebanon, MD

## 2022-06-14 ENCOUNTER — Encounter: Payer: Self-pay | Admitting: Internal Medicine

## 2022-06-14 DIAGNOSIS — Z713 Dietary counseling and surveillance: Secondary | ICD-10-CM | POA: Insufficient documentation

## 2022-06-14 NOTE — Assessment & Plan Note (Signed)
Doing well on zepbound.  Cut out carbonated drinks.  Burping resolved.  Has lost weight.  Is watching diet and exercising.  Increase zepbound to 5mg .  Follow.

## 2022-06-14 NOTE — Assessment & Plan Note (Signed)
Discussed.  Doing well on zoloft.  Continue current dose.  Follow.  

## 2022-06-14 NOTE — Assessment & Plan Note (Addendum)
Low cholesterol diet and exercise.  Has lost weight.  Follow lipid panel.  

## 2022-07-13 ENCOUNTER — Encounter: Payer: Self-pay | Admitting: Internal Medicine

## 2022-07-13 NOTE — Telephone Encounter (Signed)
Please document how much she is weighing (how much weight loss, etc).  Just need a little more information before sending in the medication

## 2022-07-13 NOTE — Telephone Encounter (Signed)
Ok to send in 7.5mg  dose. (Q week)

## 2022-07-13 NOTE — Telephone Encounter (Signed)
Ok to increase her dose?

## 2022-07-14 ENCOUNTER — Other Ambulatory Visit: Payer: Self-pay

## 2022-07-14 MED ORDER — TIRZEPATIDE-WEIGHT MANAGEMENT 7.5 MG/0.5ML ~~LOC~~ SOAJ: 7.5000 mg | SUBCUTANEOUS | 2 refills | Status: DC

## 2022-09-04 ENCOUNTER — Encounter: Payer: Self-pay | Admitting: Internal Medicine

## 2022-09-04 MED ORDER — ZEPBOUND 10 MG/0.5ML ~~LOC~~ SOAJ: 10.0000 mg | SUBCUTANEOUS | 0 refills | Status: DC

## 2022-09-04 NOTE — Telephone Encounter (Signed)
I am ok to increase to 10mg , but if she is continuing to lose weight, I would hold on increasing.  Will need to continue to follow for side effects.

## 2022-09-24 ENCOUNTER — Encounter (INDEPENDENT_AMBULATORY_CARE_PROVIDER_SITE_OTHER): Payer: Self-pay

## 2022-10-13 ENCOUNTER — Telehealth: Payer: Self-pay

## 2022-10-13 NOTE — Telephone Encounter (Signed)
LM for patient to return call. She has an appt with Dr Lorin Picket at 9:00 on 9/11. Need to see if she can come at 11:00 that day instead. Dr Lorin Picket has a meeting. I have that spot on hold for her. Let me know if this is a problem and I can find another appt spot for her. If no problem, please move her appt.

## 2022-10-21 ENCOUNTER — Ambulatory Visit (INDEPENDENT_AMBULATORY_CARE_PROVIDER_SITE_OTHER): Payer: BC Managed Care – PPO | Admitting: Internal Medicine

## 2022-10-21 ENCOUNTER — Other Ambulatory Visit (HOSPITAL_COMMUNITY)
Admission: RE | Admit: 2022-10-21 | Discharge: 2022-10-21 | Disposition: A | Discharge status: Home / Self Care | Payer: BC Managed Care – PPO | Source: Ambulatory Visit | Attending: Internal Medicine | Admitting: Internal Medicine

## 2022-10-21 ENCOUNTER — Encounter: Payer: Self-pay | Admitting: Internal Medicine

## 2022-10-21 VITALS — BP 116/70 | HR 79 | Temp 98.3°F | Ht 60.0 in | Wt 132.0 lb

## 2022-10-21 DIAGNOSIS — F439 Reaction to severe stress, unspecified: Secondary | ICD-10-CM

## 2022-10-21 DIAGNOSIS — Z124 Encounter for screening for malignant neoplasm of cervix: Secondary | ICD-10-CM

## 2022-10-21 DIAGNOSIS — L659 Nonscarring hair loss, unspecified: Secondary | ICD-10-CM

## 2022-10-21 DIAGNOSIS — Z713 Dietary counseling and surveillance: Secondary | ICD-10-CM

## 2022-10-21 DIAGNOSIS — E7800 Pure hypercholesterolemia, unspecified: Secondary | ICD-10-CM | POA: Diagnosis not present

## 2022-10-21 DIAGNOSIS — Z0000 Encounter for general adult medical examination without abnormal findings: Secondary | ICD-10-CM | POA: Diagnosis not present

## 2022-10-21 MED ORDER — TIRZEPATIDE-WEIGHT MANAGEMENT 12.5 MG/0.5ML ~~LOC~~ SOAJ: 12.5000 mg | SUBCUTANEOUS | 0 refills | Status: DC

## 2022-10-21 MED ORDER — SERTRALINE HCL 25 MG PO TABS: 25.0000 mg | ORAL_TABLET | Freq: Every day | ORAL | 1 refills | Status: DC

## 2022-10-21 NOTE — Progress Notes (Signed)
Subjective:    Patient ID: Holly Oliver, female    DOB: [birth year], 42 y.o.   MRN: [id number]  Patient here for  Chief Complaint  Patient presents with   Annual Exam    HPI Here for a physical exam. S/p right breast lumpectomy 06/05/22. Pathology ok - intraductal papilloma. On zoloft.  Was started on zepbound. Has increased to 10mg . Doing well on the medication.  Tolerating.  Eating.  No nausea or vomiting.  No abdominal pain or bowel change.  Does report noticing some tooth sensitivity with zepbound.  Overall better.  Does not feel a significant issue for her.  Increased stress - family stress.  Discussed.  Does not feel needs any further intervention. Has good support.    Past Medical History:  Diagnosis Date   Family history of adverse reaction to anesthesia    Maternal Grandmother -PONV   Heart murmur    followed by PCP   History of chicken pox    Past Surgical History:  Procedure Laterality Date   BREAST BIOPSY Right 03/24/2022   10:00 4cmfn heart clip path pending   BREAST BIOPSY Right 03/24/2022   11:00 1cmfn venus paht pending   BREAST BIOPSY Right 03/24/2022   [place] RT BREAST BX W LOC DEV 1ST LESION IMG BX SPEC US GUIDE 03/24/2022 [facility]   BREAST BIOPSY Right 03/24/2022   [place] RT BREAST BX W LOC DEV EA ADD LESION IMG BX SPEC US GUIDE 03/24/2022 [facility]   BREAST BIOPSY Right 06/04/2022   [place] RT RADIOACTIVE SEED EA ADD LESION 06/04/2022 [facility]   BREAST BIOPSY  06/04/2022   [place] RT RADIOACTIVE SEED LOC 06/04/2022 [facility]   BREAST BIOPSY Right 06/04/2022   [place] RT RADIOACTIVE SEED EA ADD LESION 06/04/2022 [facility]   BREAST BIOPSY Right 06/04/2022   [place] RT RADIOACTIVE SEED EA ADD LESION 06/04/2022 [facility]   BREAST LUMPECTOMY WITH RADIOACTIVE SEED LOCALIZATION Right 06/05/2022   Procedure: RIGHT BREAST LUMPECTOMY WITH RADIOACTIVE SEED LOCALIZATION X2;  Surgeon: Griselda Miner, MD;  Location: [facility];  Service:  General;  Laterality: Right;   CESAREAN SECTION  11/02/2009   TONSILLECTOMY N/A 01/29/2017   Procedure: TONSILLECTOMY;  Surgeon: Linus Salmons, MD;  Location: [facility];  Service: ENT;  Laterality: N/A;   Family History  Problem Relation Age of Onset   Hypertension Mother    Hyperlipidemia Mother    Hyperlipidemia Father    Lung cancer Maternal Grandfather    Prostate cancer Maternal Grandfather    Breast cancer Maternal Aunt        great aunt   Diabetes Maternal Aunt    Social History   Socioeconomic History   Marital status: Married    Spouse name: Not on file   Number of children: 1   Years of education: Not on file   Highest education level: Bachelor's degree (e.g., BA, AB, BS)  Occupational History   Occupation: Lobbyist: [employer]  Tobacco Use   Smoking status: Never   Smokeless tobacco: Never  Vaping Use   Vaping status: Never Used  Substance and Sexual Activity   Alcohol use: Yes    Alcohol/week: 0.0 standard drinks of alcohol    Comment: Holidays   Drug use: No   Sexual activity: Not on file  Other Topics Concern   Not on file  Social History Narrative   Not on file   Social Determinants of Health   Financial Resource  Strain: Low Risk  (06/07/2022)   Overall Financial Resource Strain (CARDIA)    Difficulty of Paying Living Expenses: Not hard at all  Food Insecurity: No Food Insecurity (06/07/2022)   Hunger Vital Sign    Worried About Running Out of Food in the Last Year: Never true    Ran Out of Food in the Last Year: Never true  Transportation Needs: No Transportation Needs (06/07/2022)   PRAPARE - Administrator, Civil Service (Medical): No    Lack of Transportation (Non-Medical): No  Physical Activity: Insufficiently Active (06/07/2022)   Exercise Vital Sign    Days of Exercise per Week: 3 days    Minutes of Exercise per Session: 30 min  Stress: No Stress Concern Present (06/07/2022)   [employer] of  Occupational Health - Occupational Stress Questionnaire    Feeling of Stress : Only a little  Social Connections: Moderately Isolated (06/07/2022)   Social Connection and Isolation Panel [NHANES]    Frequency of Communication with Friends and Family: More than three times a week    Frequency of Social Gatherings with Friends and Family: Once a week    Attends Religious Services: Never    Database administrator or Organizations: No    Attends Engineer, structural: Not on file    Marital Status: Married     Review of Systems  Constitutional:  Negative for appetite change and unexpected weight change.  HENT:  Negative for congestion, sinus pressure and sore throat.   Eyes:  Negative for pain and visual disturbance.  Respiratory:  Negative for cough, chest tightness and shortness of breath.   Cardiovascular:  Negative for chest pain, palpitations and leg swelling.  Gastrointestinal:  Negative for abdominal pain, diarrhea, nausea and vomiting.  Genitourinary:  Negative for difficulty urinating and dysuria.  Musculoskeletal:  Negative for joint swelling and myalgias.  Skin:  Negative for color change and rash.  Neurological:  Negative for dizziness and headaches.  Hematological:  Negative for adenopathy. Does not bruise/bleed easily.  Psychiatric/Behavioral:  Negative for agitation and dysphoric mood.        Objective:     BP 116/70   Pulse 79   Temp 98.3 F (36.8 C) (Oral)   Ht 5' (1.524 m)   Wt 132 lb (59.9 kg)   SpO2 100%   BMI 25.78 kg/m  Wt Readings from Last 3 Encounters:  10/21/22 132 lb (59.9 kg)  06/11/22 156 lb 12.8 oz (71.1 kg)  06/05/22 160 lb 4.4 oz (72.7 kg)    Physical Exam Vitals reviewed.  Constitutional:      General: She is not in acute distress.    Appearance: Normal appearance. She is well-developed.  HENT:     Head: Normocephalic and atraumatic.     Right Ear: External ear normal.     Left Ear: External ear normal.  Eyes:     General:  No scleral icterus.       Right eye: No discharge.        Left eye: No discharge.     Conjunctiva/sclera: Conjunctivae normal.  Neck:     Thyroid: No thyromegaly.  Cardiovascular:     Rate and Rhythm: Normal rate and regular rhythm.  Pulmonary:     Effort: No tachypnea, accessory muscle usage or respiratory distress.     Breath sounds: Normal breath sounds. No decreased breath sounds or wheezing.  Chest:  Breasts:    Right: No inverted nipple, mass, nipple discharge or  tenderness (no axillary adenopathy).     Left: No inverted nipple, mass, nipple discharge or tenderness (no axilarry adenopathy).  Abdominal:     General: Bowel sounds are normal.     Palpations: Abdomen is soft.     Tenderness: There is no abdominal tenderness.  Genitourinary:    Comments: Normal external genitalia.  Vaginal vault without lesions.  Cervix identified.  Pap smear performed.  Could not appreciate any adnexal masses or tenderness.   Musculoskeletal:        General: No swelling or tenderness.     Cervical back: Neck supple.  Lymphadenopathy:     Cervical: No cervical adenopathy.  Skin:    Findings: No erythema or rash.  Neurological:     Mental Status: She is alert and oriented to person, place, and time.  Psychiatric:        Mood and Affect: Mood normal.        Behavior: Behavior normal.      Outpatient Encounter Medications as of 10/21/2022  Medication Sig   tirzepatide (ZEPBOUND) 12.5 MG/0.5ML Pen Inject 12.5 mg into the skin once a week.   sertraline (ZOLOFT) 25 MG tablet Take 1 tablet (25 mg total) by mouth daily.   [DISCONTINUED] sertraline (ZOLOFT) 25 MG tablet TAKE 1 TABLET (25 MG TOTAL) BY MOUTH DAILY.   [DISCONTINUED] tirzepatide (ZEPBOUND) 10 MG/0.5ML Pen Inject 10 mg into the skin once a week.   No facility-administered encounter medications on file as of 10/21/2022.     Lab Results  Component Value Date   WBC 7.6 06/11/2022   HGB 14.6 06/11/2022   HCT 44.1 06/11/2022   PLT  235.0 06/11/2022   GLUCOSE 83 06/11/2022   CHOL 191 06/11/2022   TRIG 118.0 06/11/2022   HDL 41.50 06/11/2022   LDLCALC 126 (H) 06/11/2022   ALT 13 06/11/2022   AST 15 06/11/2022   NA 139 06/11/2022   K 4.0 06/11/2022   CL 106 06/11/2022   CREATININE 1.04 06/11/2022   BUN 10 06/11/2022   CO2 24 06/11/2022   TSH 2.73 06/11/2022   HGBA1C 5.5 07/15/2021    MM Breast Surgical Specimen  Result Date: 06/05/2022 CLINICAL DATA:  Status post surgical excision today. 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 Two additional RIGHT breast masses were not biopsied, but were localized for excision with radioactive seed. In total, 4 radioactive seeds were placed. EXAM: SPECIMEN RADIOGRAPH OF THE RIGHT BREAST COMPARISON:  Previous exam(s). FINDINGS: Status post excision of the right breast. The 4 radioactive seeds and the 2 biopsy marker clips are present and appear completely intact within the specimen. Findings discussed with the OR staff during the procedure. IMPRESSION: Specimen radiograph of the right breast. Electronically Signed   By: Bary Richard M.D.   On: 06/05/2022 09:41      Assessment & Plan:  Routine general medical examination at a health care facility  Hypercholesteremia Assessment & Plan: Low cholesterol diet and exercise.  Has lost weight. Follow lipid panel.   Orders: -     Comprehensive metabolic panel; Future -     Lipid panel; Future  Health care maintenance Assessment & Plan: Physical today 10/21/22.  PAP 10/10/20 - negative with negative HPV.  Follow up pap today. Mammogram 03/10/22 - recommended f/u views and subsequent biopsy/lumpectomy as outlined - pathology ok.    Screening for cervical cancer -     Cytology - PAP  Hair loss Assessment & Plan: Check routine  labs - including tsh  Orders: -     TSH; Future  Stress Assessment & Plan: Discussed. Increased stress as outlined. On zoloft.  Continue  current dose.  Follow. Notify me if feels needs further intervention.     Weight loss counseling, encounter for Assessment & Plan: Doing well on zepbound.  Has lost weight.  Is watching diet and exercising.  Increase zepbound to 12.5mg .  Follow. Tolerating.  Discussed possible side effects.     Other orders -     Sertraline HCl; Take 1 tablet (25 mg total) by mouth daily.  Dispense: 90 tablet; Refill: 1 -     Tirzepatide-Weight Management; Inject 12.5 mg into the skin once a week.  Dispense: 2 mL; Refill: 0     Dale Lodi, MD

## 2022-10-21 NOTE — Telephone Encounter (Signed)
Noted  

## 2022-10-21 NOTE — Assessment & Plan Note (Addendum)
Physical today 10/21/22.  PAP 10/10/20 - negative with negative HPV.  Follow up pap today. Mammogram 03/10/22 - recommended f/u views and subsequent biopsy/lumpectomy as outlined - pathology ok.

## 2022-10-21 NOTE — Telephone Encounter (Signed)
Patient states she is returning call from Rita Ohara, LPN.  I read message from Bethann Berkshire to patient.  Patient states she was on vacation when we called.  Patient states she sees that the appointment is scheduled for 10/21/2022 at 10:30am and she is fine with this.

## 2022-10-22 LAB — CYTOLOGY - PAP
Comment: NEGATIVE
Diagnosis: NEGATIVE
High risk HPV: NEGATIVE

## 2022-10-25 ENCOUNTER — Encounter: Payer: Self-pay | Admitting: Internal Medicine

## 2022-10-25 NOTE — Assessment & Plan Note (Addendum)
Doing well on zepbound.  Has lost weight.  Is watching diet and exercising.  Increase zepbound to 12.5mg .  Follow. Tolerating.  Discussed possible side effects.

## 2022-10-25 NOTE — Assessment & Plan Note (Signed)
Low cholesterol diet and exercise.  Has lost weight.  Follow lipid panel.

## 2022-10-25 NOTE — Assessment & Plan Note (Signed)
Discussed. Increased stress as outlined. On zoloft.  Continue current dose.  Follow. Notify me if feels needs further intervention.

## 2022-10-25 NOTE — Assessment & Plan Note (Signed)
Check routine labs - including tsh

## 2022-11-04 ENCOUNTER — Other Ambulatory Visit: Payer: BC Managed Care – PPO

## 2022-12-22 ENCOUNTER — Encounter: Payer: Self-pay | Admitting: Internal Medicine

## 2022-12-23 MED ORDER — TIRZEPATIDE-WEIGHT MANAGEMENT 12.5 MG/0.5ML ~~LOC~~ SOAJ: 12.5000 mg | SUBCUTANEOUS | 0 refills | Status: DC

## 2022-12-23 NOTE — Telephone Encounter (Signed)
Given that she has continued to lose weight and weight is 121lbs now, recommend continuing current dose and we can discuss at her next appt

## 2022-12-23 NOTE — Telephone Encounter (Signed)
Current dose is 12.5 mg. Ok to increase?

## 2023-01-26 ENCOUNTER — Ambulatory Visit: Payer: BC Managed Care – PPO | Admitting: Internal Medicine

## 2023-01-26 ENCOUNTER — Encounter: Payer: Self-pay | Admitting: Internal Medicine

## 2023-01-26 VITALS — BP 104/66 | HR 74 | Temp 97.9°F | Resp 16 | Ht 60.0 in | Wt 118.6 lb

## 2023-01-26 DIAGNOSIS — F439 Reaction to severe stress, unspecified: Secondary | ICD-10-CM

## 2023-01-26 DIAGNOSIS — Z713 Dietary counseling and surveillance: Secondary | ICD-10-CM

## 2023-01-26 DIAGNOSIS — L659 Nonscarring hair loss, unspecified: Secondary | ICD-10-CM

## 2023-01-26 DIAGNOSIS — E7800 Pure hypercholesterolemia, unspecified: Secondary | ICD-10-CM | POA: Diagnosis not present

## 2023-01-26 DIAGNOSIS — Z1231 Encounter for screening mammogram for malignant neoplasm of breast: Secondary | ICD-10-CM

## 2023-01-26 MED ORDER — ZEPBOUND 15 MG/0.5ML ~~LOC~~ SOAJ: 15.0000 mg | SUBCUTANEOUS | 3 refills | Status: DC

## 2023-01-26 MED ORDER — SERTRALINE HCL 25 MG PO TABS: 25.0000 mg | ORAL_TABLET | Freq: Every day | ORAL | 1 refills | Status: AC

## 2023-01-26 NOTE — Progress Notes (Signed)
Subjective:    Patient ID: Holly Oliver, female    DOB: 05-20-1980, 42 y.o.   MRN: [id number]  Patient here for  Chief Complaint  Patient presents with   Medical Management of Chronic Issues    HPI Here for a scheduled follow up - f/u regarding increased stress - on zoloft.  Also taking zepbound for weight loss. States she is tolerating zepbound at current dose.  She wants to increased the dose. No increased acid reflux. No abdominal pain and not increased constipation. Has adjusted diet. Stays active.    Past Medical History:  Diagnosis Date   Family history of adverse reaction to anesthesia    Maternal Grandmother -PONV   Heart murmur    followed by PCP   History of chicken pox    Past Surgical History:  Procedure Laterality Date   BREAST BIOPSY Right 03/24/2022   10:00 4cmfn heart clip path pending   BREAST BIOPSY Right 03/24/2022   11:00 1cmfn venus paht pending   BREAST BIOPSY Right 03/24/2022   [place] RT BREAST BX W LOC DEV 1ST LESION IMG BX SPEC US GUIDE 03/24/2022 [facility]   BREAST BIOPSY Right 03/24/2022   [place] RT BREAST BX W LOC DEV EA ADD LESION IMG BX SPEC US GUIDE 03/24/2022 [facility]   BREAST BIOPSY Right 06/04/2022   [place] RT RADIOACTIVE SEED EA ADD LESION 06/04/2022 [facility]   BREAST BIOPSY  06/04/2022   [place] RT RADIOACTIVE SEED LOC 06/04/2022 [facility]   BREAST BIOPSY Right 06/04/2022   [place] RT RADIOACTIVE SEED EA ADD LESION 06/04/2022 [facility]   BREAST BIOPSY Right 06/04/2022   [place] RT RADIOACTIVE SEED EA ADD LESION 06/04/2022 [facility]   BREAST LUMPECTOMY WITH RADIOACTIVE SEED LOCALIZATION Right 06/05/2022   Procedure: RIGHT BREAST LUMPECTOMY WITH RADIOACTIVE SEED LOCALIZATION X2;  Surgeon: Griselda Miner, MD;  Location: [facility];  Service: General;  Laterality: Right;   CESAREAN SECTION  11/02/2009   TONSILLECTOMY N/A 01/29/2017   Procedure: TONSILLECTOMY;  Surgeon: Linus Salmons, MD;  Location[facility];  Service: ENT;  Laterality: N/A;   Family History  Problem Relation Age of Onset   Hypertension Mother    Hyperlipidemia Mother    Hyperlipidemia Father    Lung cancer Maternal Grandfather    Prostate cancer Maternal Grandfather    Breast cancer Maternal Aunt        great aunt   Diabetes Maternal Aunt    Social History   Socioeconomic History   Marital status: Married    Spouse name: Not on file   Number of children: 1   Years of education: Not on file   Highest education level: Bachelor's degree (e.g., BA, AB, BS)  Occupational History   Occupation: Lobbyist: [employer]  Tobacco Use   Smoking status: Never   Smokeless tobacco: Never  Vaping Use   Vaping status: Never Used  Substance and Sexual Activity   Alcohol use: Yes    Alcohol/week: 0.0 standard drinks of alcohol    Comment: Holidays   Drug use: No   Sexual activity: Not on file  Other Topics Concern   Not on file  Social History Narrative   Not on file   Social Drivers of Health   Financial Resource Strain: Low Risk  (06/07/2022)   Overall Financial Resource Strain (CARDIA)    Difficulty of Paying Living Expenses: Not hard at all  Food Insecurity: No Food Insecurity (06/07/2022)  Hunger Vital Sign    Worried About Running Out of Food in the Last Year: Never true    Ran Out of Food in the Last Year: Never true  Transportation Needs: No Transportation Needs (06/07/2022)   PRAPARE - Administrator, Civil Service (Medical): No    Lack of Transportation (Non-Medical): No  Physical Activity: Insufficiently Active (06/07/2022)   Exercise Vital Sign    Days of Exercise per Week: 3 days    Minutes of Exercise per Session: 30 min  Stress: No Stress Concern Present (06/07/2022)   [employer] of Occupational Health - Occupational Stress Questionnaire    Feeling of Stress : Only a little  Social Connections: Moderately Isolated (06/07/2022)   Social Connection and  Isolation Panel [NHANES]    Frequency of Communication with Friends and Family: More than three times a week    Frequency of Social Gatherings with Friends and Family: Once a week    Attends Religious Services: Never    Database administrator or Organizations: No    Attends Engineer, structural: Not on file    Marital Status: Married     Review of Systems  Constitutional:  Negative for appetite change and unexpected weight change.  HENT:  Negative for congestion and sinus pressure.   Respiratory:  Negative for cough, chest tightness and shortness of breath.   Cardiovascular:  Negative for chest pain, palpitations and leg swelling.  Gastrointestinal:  Negative for abdominal pain, diarrhea, nausea and vomiting.  Genitourinary:  Negative for difficulty urinating and dysuria.  Musculoskeletal:  Negative for joint swelling and myalgias.  Skin:  Negative for color change and rash.  Neurological:  Negative for dizziness and headaches.  Psychiatric/Behavioral:  Negative for agitation and dysphoric mood.        Objective:     BP 104/66   Pulse 74   Temp 97.9 F (36.6 C)   Resp 16   Ht 5' (1.524 m)   Wt 118 lb 9.6 oz (53.8 kg)   SpO2 98%   BMI 23.16 kg/m  Wt Readings from Last 3 Encounters:  01/26/23 118 lb 9.6 oz (53.8 kg)  10/21/22 132 lb (59.9 kg)  06/11/22 156 lb 12.8 oz (71.1 kg)    Physical Exam Vitals reviewed.  Constitutional:      General: She is not in acute distress.    Appearance: Normal appearance.  HENT:     Head: Normocephalic and atraumatic.     Right Ear: External ear normal.     Left Ear: External ear normal.     Mouth/Throat:     Pharynx: No oropharyngeal exudate or posterior oropharyngeal erythema.  Eyes:     General: No scleral icterus.       Right eye: No discharge.        Left eye: No discharge.     Conjunctiva/sclera: Conjunctivae normal.  Neck:     Thyroid: No thyromegaly.  Cardiovascular:     Rate and Rhythm: Normal rate and  regular rhythm.  Pulmonary:     Effort: No respiratory distress.     Breath sounds: Normal breath sounds. No wheezing.  Abdominal:     General: Bowel sounds are normal.     Palpations: Abdomen is soft.     Tenderness: There is no abdominal tenderness.  Musculoskeletal:        General: No swelling or tenderness.     Cervical back: Neck supple. No tenderness.  Lymphadenopathy:  Cervical: No cervical adenopathy.  Skin:    Findings: No erythema or rash.  Neurological:     Mental Status: She is alert.  Psychiatric:        Mood and Affect: Mood normal.        Behavior: Behavior normal.      Outpatient Encounter Medications as of 01/26/2023  Medication Sig   tirzepatide (ZEPBOUND) 15 MG/0.5ML Pen Inject 15 mg into the skin once a week.   [DISCONTINUED] sertraline (ZOLOFT) 25 MG tablet Take 1 tablet (25 mg total) by mouth daily.   [DISCONTINUED] tirzepatide (ZEPBOUND) 12.5 MG/0.5ML Pen Inject 12.5 mg into the skin once a week.   sertraline (ZOLOFT) 25 MG tablet Take 1 tablet (25 mg total) by mouth daily.   No facility-administered encounter medications on file as of 01/26/2023.     Lab Results  Component Value Date   WBC 7.6 06/11/2022   HGB 14.6 06/11/2022   HCT 44.1 06/11/2022   PLT 235.0 06/11/2022   GLUCOSE 83 06/11/2022   CHOL 191 06/11/2022   TRIG 118.0 06/11/2022   HDL 41.50 06/11/2022   LDLCALC 126 (H) 06/11/2022   ALT 13 06/11/2022   AST 15 06/11/2022   NA 139 06/11/2022   K 4.0 06/11/2022   CL 106 06/11/2022   CREATININE 1.04 06/11/2022   BUN 10 06/11/2022   CO2 24 06/11/2022   TSH 2.73 06/11/2022   HGBA1C 5.5 07/15/2021    No results found.     Assessment & Plan:  Encounter for screening mammogram for malignant neoplasm of breast Assessment & Plan: Breast cancer screening. Due mammogram.  Schedule.   Orders: -     3D Screening Mammogram, Left and Right; Future  Hair loss -     TSH  Hypercholesteremia Assessment & Plan: Low cholesterol  diet and exercise.  Has lost weight. Follow lipid panel.   Orders: -     Lipid panel -     Comprehensive metabolic panel  Weight loss counseling, encounter for Assessment & Plan: Doing well on zepbound.  Has lost weight.  Is watching diet and exercising.  Increase zepbound to 15mg .  Follow. Tolerating.  Discussed possible side effects.     Stress Assessment & Plan: Discussed. Increased stress as outlined. On zoloft.  Continue current dose.  Follow. Notify me if feels needs further intervention.     Other orders -     Sertraline HCl; Take 1 tablet (25 mg total) by mouth daily.  Dispense: 90 tablet; Refill: 1 -     Zepbound; Inject 15 mg into the skin once a week.  Dispense: 2 mL; Refill: 3     Dale Twining, MD

## 2023-01-30 ENCOUNTER — Encounter: Payer: Self-pay | Admitting: Internal Medicine

## 2023-01-30 DIAGNOSIS — Z1239 Encounter for other screening for malignant neoplasm of breast: Secondary | ICD-10-CM | POA: Insufficient documentation

## 2023-01-30 NOTE — Assessment & Plan Note (Signed)
Discussed. Increased stress as outlined. On zoloft.  Continue current dose.  Follow. Notify me if feels needs further intervention.

## 2023-01-30 NOTE — Assessment & Plan Note (Signed)
Doing well on zepbound.  Has lost weight.  Is watching diet and exercising.  Increase zepbound to 15mg .  Follow. Tolerating.  Discussed possible side effects.

## 2023-01-30 NOTE — Assessment & Plan Note (Signed)
Breast cancer screening. Due mammogram.  Schedule.

## 2023-01-30 NOTE — Assessment & Plan Note (Signed)
Low cholesterol diet and exercise.  Has lost weight.  Follow lipid panel.

## 2023-03-15 ENCOUNTER — Ambulatory Visit
Admission: RE | Admit: 2023-03-15 | Discharge: 2023-03-15 | Disposition: A | Discharge status: Home / Self Care | Payer: 59 | Source: Ambulatory Visit | Attending: Internal Medicine | Admitting: Internal Medicine

## 2023-03-15 DIAGNOSIS — Z1231 Encounter for screening mammogram for malignant neoplasm of breast: Secondary | ICD-10-CM | POA: Insufficient documentation

## 2023-04-30 ENCOUNTER — Telehealth: Payer: Self-pay | Admitting: Internal Medicine

## 2023-04-30 ENCOUNTER — Telehealth: Payer: BC Managed Care – PPO | Admitting: Internal Medicine

## 2023-04-30 VITALS — Ht 60.0 in | Wt 108.0 lb

## 2023-04-30 DIAGNOSIS — F439 Reaction to severe stress, unspecified: Secondary | ICD-10-CM | POA: Diagnosis not present

## 2023-04-30 DIAGNOSIS — N926 Irregular menstruation, unspecified: Secondary | ICD-10-CM

## 2023-04-30 DIAGNOSIS — R0981 Nasal congestion: Secondary | ICD-10-CM

## 2023-04-30 DIAGNOSIS — E7800 Pure hypercholesterolemia, unspecified: Secondary | ICD-10-CM | POA: Diagnosis not present

## 2023-04-30 DIAGNOSIS — Z713 Dietary counseling and surveillance: Secondary | ICD-10-CM

## 2023-04-30 NOTE — Progress Notes (Signed)
 Patient ID: Holly Oliver, female   DOB: 05-Sep-1980, 43 y.o.   MRN: [id number]   Virtual Visit via video Note  I connected with Sherry Ruffing by a video enabled telemedicine application and verified that I am speaking with the correct person using two identifiers. Location patient: home Location provider: work Persons participating in the virtual visit: patient, provider  The limitations, risks, security and privacy concerns of performing an evaluation and management service by video and the availability of in person appointments have been discussed. It has also been discussed with the patient that there may be a patient responsible charge related to this service. The patient expressed understanding and agreed to proceed.   Reason for visit: follow up appt  HPI: Here for a follow up appt - follow regarding increased stress and weight loss. Had been on zepbound. Lost down to 102 pounds. Stopped zepbound. Watching diet. Weight stabilized around 108lbs. Eating. Evaluated 04/28/23 - URI. Tested negative for covid, flu and RSV. Reports having increased nasal congestion and post nasal drainage. Symptoms started approximately 10 days ago. Some cough. No wheezing or sob. Had temp 99.4 several days ago. No fever since. No vomiting or diarrhea. Handling stress. No period since 03/24/23. Home pregnancy test negative. Husband has had a vasectomy.    ROS: See pertinent positives and negatives per HPI.  Past Medical History:  Diagnosis Date   Family history of adverse reaction to anesthesia    Maternal Grandmother -PONV   Heart murmur    followed by PCP   History of chicken pox     Past Surgical History:  Procedure Laterality Date   BREAST BIOPSY Right 03/24/2022   10:00 4cmfn heart clip FRAGMENTS OF BENIGN INTRADUCTAL PAPILLOMA WITH ASSOCIATED USUAL DUCTAL  HYPERPLASIA AND COLUMNAR CELL CHANGE WITHOUT ATYPIA.   BREAST BIOPSY Right 03/24/2022   11:00 1cmfn venus FIBROEPITHELIAL PROLIFERATION WITH  SCLEROSIS AND FLORID USUAL DUCTAL  HYPERPLASIA. FIBROEPITHELIAL PROLIFERATION WITH SCLEROSIS AND FLORID USUAL DUCTAL  HYPERPLASIA.   BREAST BIOPSY Right 03/24/2022   [place] RT BREAST BX W LOC DEV 1ST LESION IMG BX SPEC US GUIDE 03/24/2022 [facility]   BREAST BIOPSY Right 03/24/2022   [place] RT BREAST BX W LOC DEV EA ADD LESION IMG BX SPEC US GUIDE 03/24/2022 [facility]   BREAST BIOPSY Right 06/04/2022   [place] RT RADIOACTIVE SEED EA ADD LESION 06/04/2022 [facility]   BREAST BIOPSY  06/04/2022   [place] RT RADIOACTIVE SEED LOC 06/04/2022 [facility]   BREAST BIOPSY Right 06/04/2022   [place] RT RADIOACTIVE SEED EA ADD LESION 06/04/2022 [facility]   BREAST BIOPSY Right 06/04/2022   [place] RT RADIOACTIVE SEED EA ADD LESION 06/04/2022 [facility]   BREAST LUMPECTOMY WITH RADIOACTIVE SEED LOCALIZATION Right 06/05/2022   Procedure: RIGHT BREAST LUMPECTOMY WITH RADIOACTIVE SEED LOCALIZATION X2;  Surgeon: Griselda Miner, MD;  Location: [facility];  Service: General;  Laterality: Right;   CESAREAN SECTION  11/02/2009   TONSILLECTOMY N/A 01/29/2017   Procedure: TONSILLECTOMY;  Surgeon: Linus Salmons, MD;  [facility];  Service: ENT;  Laterality: N/A;    Family History  Problem Relation Age of Onset   Hypertension Mother    Hyperlipidemia Mother    Hyperlipidemia Father    Lung cancer Maternal Grandfather    Prostate cancer Maternal Grandfather    Breast cancer Maternal Aunt        great aunt   Diabetes Maternal Aunt     SOCIAL HX: reviewed.    Current  Outpatient Medications:    hydrocortisone-pramoxine (ANALPRAM HC) 2.5-1 % rectal cream, Place 1 Application rectally 3 (three) times daily., Disp: 30 g, Rfl: 0   sertraline (ZOLOFT) 25 MG tablet, Take 1 tablet (25 mg total) by mouth daily., Disp: 90 tablet, Rfl: 1  EXAM:  GENERAL: alert, oriented, appears well and in no acute distress  HEENT: atraumatic, conjunttiva clear, no obvious  abnormalities on inspection of external nose and ears  NECK: normal movements of the head and neck  LUNGS: on inspection no signs of respiratory distress, breathing rate appears normal, no obvious gross SOB, gasping or wheezing  CV: no obvious cyanosis  PSYCH/NEURO: pleasant and cooperative, no obvious depression or anxiety, speech and thought processing grossly intact  ASSESSMENT AND PLAN:  Discussed the following assessment and plan:  Problem List Items Addressed This Visit     Hypercholesteremia - Primary   Has adjusted diet. Has lost weight. Follow lipid panel.       Menstrual irregularity   Last period 03/24/23. Husband has had a vasectomy. Home pregnancy test negative. Discussed - may be related to weight loss. Follow. Keep menstrual diary.       Nasal congestion   Overall better. Discussed - xyzal, flonase. Follow.       Stress   Continues on zoloft. Overall appears to be handling things relatively well.       Weight loss counseling, encounter for   Off zepbound. Weight improving some. Follow. Eating.        Return in about 2 months (around 06/30/2023) for follow-up.   I discussed the assessment and treatment plan with the patient. The patient was provided an opportunity to ask questions and all were answered. The patient agreed with the plan and demonstrated an understanding of the instructions.   The patient was advised to call back or seek an in-person evaluation if the symptoms worsen or if the condition fails to improve as anticipated.    Dale [place], MD

## 2023-04-30 NOTE — Telephone Encounter (Signed)
 Unable to reach pt, voicemail full, for verifying info and collecting copay. Patient needs new insurance card uploaded.  [id number], please transfer patient to office if she calls back to collect copay. -kh

## 2023-05-04 ENCOUNTER — Encounter: Payer: Self-pay | Admitting: Internal Medicine

## 2023-05-04 NOTE — Telephone Encounter (Signed)
 Please call and confirm what the "growth" looks like.  Infection?  I am ok to see her, just want to make sure she gets to the right place.

## 2023-05-04 NOTE — Telephone Encounter (Signed)
 Per patient, area around the anus irritated Saturday. Noticed the "growth" yesterday. Purple/dark red at the end. No drainage or oozing. Pt reports no sign of infection. Pt has been scheduled for acute visit tomorrow to have it checked.

## 2023-05-04 NOTE — Telephone Encounter (Signed)
 LMTCB

## 2023-05-05 ENCOUNTER — Encounter: Payer: Self-pay | Admitting: Internal Medicine

## 2023-05-05 ENCOUNTER — Ambulatory Visit (INDEPENDENT_AMBULATORY_CARE_PROVIDER_SITE_OTHER): Admitting: Internal Medicine

## 2023-05-05 VITALS — BP 90/70 | HR 70 | Temp 97.9°F | Ht 60.0 in | Wt 114.2 lb

## 2023-05-05 DIAGNOSIS — L659 Nonscarring hair loss, unspecified: Secondary | ICD-10-CM | POA: Diagnosis not present

## 2023-05-05 DIAGNOSIS — K645 Perianal venous thrombosis: Secondary | ICD-10-CM | POA: Diagnosis not present

## 2023-05-05 DIAGNOSIS — Z713 Dietary counseling and surveillance: Secondary | ICD-10-CM

## 2023-05-05 DIAGNOSIS — E7800 Pure hypercholesterolemia, unspecified: Secondary | ICD-10-CM | POA: Diagnosis not present

## 2023-05-05 DIAGNOSIS — N926 Irregular menstruation, unspecified: Secondary | ICD-10-CM | POA: Diagnosis not present

## 2023-05-05 LAB — CBC WITH DIFFERENTIAL/PLATELET
Basophils Absolute: 0 10*3/uL (ref 0.0–0.1)
Basophils Relative: 0.5 % (ref 0.0–3.0)
Eosinophils Absolute: 0.1 10*3/uL (ref 0.0–0.7)
Eosinophils Relative: 1.4 % (ref 0.0–5.0)
HCT: 38.7 % (ref 36.0–46.0)
Hemoglobin: 12.8 g/dL (ref 12.0–15.0)
Lymphocytes Relative: 35.4 % (ref 12.0–46.0)
Lymphs Abs: 2.3 10*3/uL (ref 0.7–4.0)
MCHC: 33 g/dL (ref 30.0–36.0)
MCV: 92 fl (ref 78.0–100.0)
Monocytes Absolute: 0.4 10*3/uL (ref 0.1–1.0)
Monocytes Relative: 6.6 % (ref 3.0–12.0)
Neutro Abs: 3.7 10*3/uL (ref 1.4–7.7)
Neutrophils Relative %: 56.1 % (ref 43.0–77.0)
Platelets: 351 10*3/uL (ref 150.0–400.0)
RBC: 4.21 Mil/uL (ref 3.87–5.11)
RDW: 14.3 % (ref 11.5–15.5)
WBC: 6.6 10*3/uL (ref 4.0–10.5)

## 2023-05-05 LAB — COMPREHENSIVE METABOLIC PANEL WITH GFR
ALT: 8 U/L (ref 0–35)
AST: 12 U/L (ref 0–37)
Albumin: 3.9 g/dL (ref 3.5–5.2)
Alkaline Phosphatase: 57 U/L (ref 39–117)
BUN: 15 mg/dL (ref 6–23)
CO2: 28 meq/L (ref 19–32)
Calcium: 8.6 mg/dL (ref 8.4–10.5)
Chloride: 106 meq/L (ref 96–112)
Creatinine, Ser: 0.86 mg/dL (ref 0.40–1.20)
GFR: 84 mL/min (ref >60.00)
Glucose, Bld: 82 mg/dL (ref 70–99)
Potassium: 4 meq/L (ref 3.5–5.1)
Sodium: 141 meq/L (ref 135–145)
Total Bilirubin: 0.4 mg/dL (ref 0.2–1.2)
Total Protein: 6.3 g/dL (ref 6.0–8.3)

## 2023-05-05 LAB — LIPID PANEL
Cholesterol: 181 mg/dL (ref 0–200)
HDL: 38.2 mg/dL — ABNORMAL LOW (ref >39.00)
LDL Cholesterol: 126 mg/dL — ABNORMAL HIGH (ref 0–99)
NonHDL: 143.05
Total CHOL/HDL Ratio: 5
Triglycerides: 87 mg/dL (ref 0.0–149.0)
VLDL: 17.4 mg/dL (ref 0.0–40.0)

## 2023-05-05 MED ORDER — HYDROCORT-PRAMOXINE (PERIANAL) 2.5-1 % EX CREA: 1.0000 | TOPICAL_CREAM | Freq: Three times a day (TID) | CUTANEOUS | 0 refills | Status: AC

## 2023-05-05 NOTE — Progress Notes (Unsigned)
 Subjective:    Patient ID: Holly Oliver, female    DOB: Jan 10, 1981, 43 y.o.   MRN: [id number]  Patient here for  Chief Complaint  Patient presents with   Hemorrhoids   Amenorrhea    HPI Here for work in appt - work in with concerns regarding a possible hemorrhoid. Increased pain and fullness - rectum. Normal bowel movements - q 2-3 days. 05/02/23 - noticed some rectal irritation. Symptoms worsened. Noticed increased tissue fullness and increased pain. Last bowel movement 05/03/23. No abdominal pain. No bleeding. Has been working out. Doing squats. No change in activity. Increased pain with sitting > standing.    Past Medical History:  Diagnosis Date   Family history of adverse reaction to anesthesia    Maternal Grandmother -PONV   Heart murmur    followed by PCP   History of chicken pox    Past Surgical History:  Procedure Laterality Date   BREAST BIOPSY Right 03/24/2022   10:00 4cmfn heart clip FRAGMENTS OF BENIGN INTRADUCTAL PAPILLOMA WITH ASSOCIATED USUAL DUCTAL  HYPERPLASIA AND COLUMNAR CELL CHANGE WITHOUT ATYPIA.   BREAST BIOPSY Right 03/24/2022   11:00 1cmfn venus FIBROEPITHELIAL PROLIFERATION WITH SCLEROSIS AND FLORID USUAL DUCTAL  HYPERPLASIA. FIBROEPITHELIAL PROLIFERATION WITH SCLEROSIS AND FLORID USUAL DUCTAL  HYPERPLASIA.   BREAST BIOPSY Right 03/24/2022   [place] RT BREAST BX W LOC DEV 1ST LESION IMG BX SPEC US GUIDE 03/24/2022 [facility]   BREAST BIOPSY Right 03/24/2022   [place] RT BREAST BX W LOC DEV EA ADD LESION IMG BX SPEC US GUIDE 03/24/2022 [facility]   BREAST BIOPSY Right 06/04/2022   [place] RT RADIOACTIVE SEED EA ADD LESION 06/04/2022 [facility]   BREAST BIOPSY  06/04/2022   [place] RT RADIOACTIVE SEED LOC 06/04/2022 [facility]   BREAST BIOPSY Right 06/04/2022   [place] RT RADIOACTIVE SEED EA ADD LESION 06/04/2022 [facility]   BREAST BIOPSY Right 06/04/2022   [place] RT RADIOACTIVE SEED EA ADD LESION 06/04/2022 [facility]   BREAST LUMPECTOMY  WITH RADIOACTIVE SEED LOCALIZATION Right 06/05/2022   Procedure: RIGHT BREAST LUMPECTOMY WITH RADIOACTIVE SEED LOCALIZATION X2;  Surgeon: Griselda Miner, MD;  Location: [facility];  Service: General;  Laterality: Right;   CESAREAN SECTION  11/02/2009   TONSILLECTOMY N/A 01/29/2017   Procedure: TONSILLECTOMY;  Surgeon: Linus Salmons, MD;  Location: [facility];  Service: ENT;  Laterality: N/A;   Family History  Problem Relation Age of Onset   Hypertension Mother    Hyperlipidemia Mother    Hyperlipidemia Father    Lung cancer Maternal Grandfather    Prostate cancer Maternal Grandfather    Breast cancer Maternal Aunt        great aunt   Diabetes Maternal Aunt    Social History   Socioeconomic History   Marital status: Married    Spouse name: Not on file   Number of children: 1   Years of education: Not on file   Highest education level: Bachelor's degree (e.g., BA, AB, BS)  Occupational History   Occupation: Lobbyist: [employer]  Tobacco Use   Smoking status: Never   Smokeless tobacco: Never  Vaping Use   Vaping status: Never Used  Substance and Sexual Activity   Alcohol use: Yes    Alcohol/week: 0.0 standard drinks of alcohol    Comment: Holidays   Drug use: No   Sexual activity: Not on file  Other Topics Concern   Not on file  Social History Narrative  Not on file   Social Drivers of Health   Financial Resource Strain: Low Risk  (05/05/2023)   Received from [facility]   Overall Financial Resource Strain (CARDIA)    Difficulty of Paying Living Expenses: Not very hard  Food Insecurity: No Food Insecurity (05/05/2023)   Received from [facility]   Hunger Vital Sign    Worried About Running Out of Food in the Last Year: Never true    Ran Out of Food in the Last Year: Never true  Transportation Needs: No Transportation Needs (05/05/2023)   Received from [facility] - Transportation    In the past 12 months, has lack of transportation kept you from medical appointments or from getting medications?: No    Lack of Transportation (Non-Medical): No  Physical Activity: Insufficiently Active (04/30/2023)   Exercise Vital Sign    Days of Exercise per Week: 2 days    Minutes of Exercise per Session: 40 min  Stress: No Stress Concern Present (04/30/2023)   [employer] of Occupational Health - Occupational Stress Questionnaire    Feeling of Stress : Not at all  Social Connections: Moderately Integrated (04/30/2023)   Social Connection and Isolation Panel [NHANES]    Frequency of Communication with Friends and Family: More than three times a week    Frequency of Social Gatherings with Friends and Family: Once a week    Attends Religious Services: 1 to 4 times per year    Active Member of [employer] or Organizations: No    Attends Engineer, structural: Not on file    Marital Status: Married     Review of Systems  Constitutional:  Negative for appetite change and unexpected weight change.  HENT:  Negative for congestion and sinus pressure.   Respiratory:  Negative for cough, chest tightness and shortness of breath.   Cardiovascular:  Negative for chest pain, palpitations and leg swelling.  Gastrointestinal:  Negative for abdominal pain, diarrhea, nausea and vomiting.  Genitourinary:  Negative for difficulty urinating and dysuria.  Musculoskeletal:  Negative for joint swelling and myalgias.  Skin:  Negative for color change and rash.  Neurological:  Negative for dizziness and headaches.  Psychiatric/Behavioral:  Negative for agitation and dysphoric mood.        Objective:     BP 90/70   Pulse 70   Temp 97.9 F (36.6 C) (Oral)   Ht 5' (1.524 m)   Wt 114 lb 3.2 oz (51.8 kg)   LMP 03/21/2023   SpO2 98%   BMI 22.30 kg/m  Wt Readings from Last 3 Encounters:  05/05/23 114 lb 3.2 oz (51.8 kg)  04/30/23 108 lb (49 kg)  01/26/23 118 lb  9.6 oz (53.8 kg)    Physical Exam Vitals reviewed.  Constitutional:      General: She is not in acute distress.    Appearance: Normal appearance.  HENT:     Head: Normocephalic and atraumatic.  Eyes:     General: No scleral icterus.       Right eye: No discharge.        Left eye: No discharge.     Conjunctiva/sclera: Conjunctivae normal.  Neck:     Thyroid: No thyromegaly.  Cardiovascular:     Rate and Rhythm: Normal rate and regular rhythm.  Pulmonary:     Effort: No respiratory distress.     Breath sounds: Normal breath sounds. No wheezing.  Abdominal:  General: Bowel sounds are normal.     Palpations: Abdomen is soft.     Tenderness: There is no abdominal tenderness.  Genitourinary:    Comments: Rectal exam - thrombosed hemorrhoid. Digital rectal exam - no mass palpated.  Musculoskeletal:        General: No swelling or tenderness.     Cervical back: Neck supple. No tenderness.  Lymphadenopathy:     Cervical: No cervical adenopathy.  Skin:    Findings: No erythema or rash.  Neurological:     Mental Status: She is alert.  Psychiatric:        Mood and Affect: Mood normal.        Behavior: Behavior normal.         Outpatient Encounter Medications as of 05/05/2023  Medication Sig   hydrocortisone-pramoxine (ANALPRAM HC) 2.5-1 % rectal cream Place 1 Application rectally 3 (three) times daily.   sertraline (ZOLOFT) 25 MG tablet Take 1 tablet (25 mg total) by mouth daily.   No facility-administered encounter medications on file as of 05/05/2023.     Lab Results  Component Value Date   WBC 7.6 06/11/2022   HGB 14.6 06/11/2022   HCT 44.1 06/11/2022   PLT 235.0 06/11/2022   GLUCOSE 83 06/11/2022   CHOL 191 06/11/2022   TRIG 118.0 06/11/2022   HDL 41.50 06/11/2022   LDLCALC 126 (H) 06/11/2022   ALT 13 06/11/2022   AST 15 06/11/2022   NA 139 06/11/2022   K 4.0 06/11/2022   CL 106 06/11/2022   CREATININE 1.04 06/11/2022   BUN 10 06/11/2022   CO2 24  06/11/2022   TSH 2.73 06/11/2022   HGBA1C 5.5 07/15/2021    MM 3D SCREENING MAMMOGRAM BILATERAL BREAST Result Date: 03/17/2023 CLINICAL DATA:  Screening. EXAM: DIGITAL SCREENING BILATERAL MAMMOGRAM WITH TOMOSYNTHESIS AND CAD TECHNIQUE: Bilateral screening digital craniocaudal and mediolateral oblique mammograms were obtained. Bilateral screening digital breast tomosynthesis was performed. The images were evaluated with computer-aided detection. COMPARISON:  Previous exam(s). ACR Breast Density Category c: The breasts are heterogeneously dense, which may obscure small masses. FINDINGS: There are no findings suspicious for malignancy. IMPRESSION: No mammographic evidence of malignancy. A result letter of this screening mammogram will be mailed directly to the patient. RECOMMENDATION: Screening mammogram in one year. (Code:SM-B-01Y) BI-RADS CATEGORY  1: Negative. Electronically Signed   By: Hulan Saas M.D.   On: 03/17/2023 08:35       Assessment & Plan:  Irregular menstrual cycle -     CBC with Differential/Platelet -     hCG, quantitative, pregnancy  Hypercholesteremia -     Comprehensive metabolic panel with GFR -     Lipid panel  Hair loss -     TSH  Hemorrhoid thrombosis Assessment & Plan: Exam c/w thrombosed hemorrhoid. Increased pain. Discussed further treatment. Keep bowels moving to try to avoid straining. Contacted general surgery (Dr Maia Plan). Agreed to see her this pm for further evaluation and treatment. Analpram - if needed for perirectal/rectal irritation.    Weight loss counseling, encounter for Assessment & Plan: She is off zepbound now. Eating. Has gained some weight and appears to be doing well. No nausea or vomiting. Follow.    Other orders -     Hydrocort-Pramoxine (Perianal); Place 1 Application rectally 3 (three) times daily.  Dispense: 30 g; Refill: 0     Dale [place], MD

## 2023-05-06 ENCOUNTER — Encounter: Payer: Self-pay | Admitting: Internal Medicine

## 2023-05-06 DIAGNOSIS — N926 Irregular menstruation, unspecified: Secondary | ICD-10-CM | POA: Insufficient documentation

## 2023-05-06 DIAGNOSIS — K645 Perianal venous thrombosis: Secondary | ICD-10-CM | POA: Insufficient documentation

## 2023-05-06 LAB — HCG, QUANTITATIVE, PREGNANCY: Quantitative HCG: <0.6 m[IU]/mL

## 2023-05-06 NOTE — Assessment & Plan Note (Signed)
 She is off zepbound now. Eating. Has gained some weight and appears to be doing well. No nausea or vomiting. Follow.

## 2023-05-06 NOTE — Assessment & Plan Note (Signed)
 Exam c/w thrombosed hemorrhoid. Increased pain. Discussed further treatment. Keep bowels moving to try to avoid straining. Contacted general surgery (Dr Maia Plan). Agreed to see her this pm for further evaluation and treatment. Analpram - if needed for perirectal/rectal irritation.

## 2023-05-06 NOTE — Assessment & Plan Note (Signed)
 Overall better. Discussed - xyzal, flonase. Follow.

## 2023-05-06 NOTE — Assessment & Plan Note (Signed)
 Continues on zoloft. Overall appears to be handling things relatively well.

## 2023-05-06 NOTE — Assessment & Plan Note (Signed)
 Off zepbound. Weight improving some. Follow. Eating.

## 2023-05-06 NOTE — Assessment & Plan Note (Signed)
 Has adjusted diet. Has lost weight. Follow lipid panel.

## 2023-05-06 NOTE — Assessment & Plan Note (Signed)
 Last period 03/24/23. Husband has had a vasectomy. Home pregnancy test negative. Discussed - may be related to weight loss. Follow. Keep menstrual diary.

## 2023-05-07 LAB — TSH: TSH: 3.47 u[IU]/mL (ref 0.35–5.50)

## 2023-05-11 NOTE — Telephone Encounter (Signed)
 Noted.

## 2023-05-11 NOTE — Telephone Encounter (Signed)
 Copied from CRM [id number]. Topic: Clinical - Lab/Test Results >> May 11, 2023 12:46 PM Holly Oliver wrote: Reason for CRM: Patient called back returning a missed call. Call was pertaining to lab results, agent relayed the message that PCP attached to those labs, and patient wanted to state that everything is great. Patient did not have any further questions.

## 2023-08-06 ENCOUNTER — Encounter: Payer: Self-pay | Admitting: Internal Medicine

## 2023-08-06 NOTE — Telephone Encounter (Signed)
 Please call and see how she is doing. What is her weight now?  If we start back, I would recommend starting with the lowest dose - 2.5mg .

## 2023-08-09 NOTE — Telephone Encounter (Signed)
 LMTCB

## 2023-08-11 MED ORDER — TIRZEPATIDE-WEIGHT MANAGEMENT 2.5 MG/0.5ML ~~LOC~~ SOLN: 2.5000 mg | SUBCUTANEOUS | 2 refills | Status: AC

## 2023-08-11 NOTE — Telephone Encounter (Signed)
 Copied from CRM [id number]. Topic: General - Other >> Aug 11, 2023  1:00 PM Holly Oliver wrote: Reason for CRM: Pt is returning a missed call from [place]. I informed the pt that Sueanne was calling to relay a message from Dr.Scott regarding the pt's message on 6/27. Pt stated that her weight is currently 145 would like to start the 2.5mg  of zepbound .

## 2023-08-11 NOTE — Telephone Encounter (Signed)
 Rx sent in for zepbound  2.5mg .
